# Patient Record
Sex: Male | Born: 1945 | Race: Black or African American | Hispanic: No | Marital: Single | Smoking: Current every day smoker
Health system: Southern US, Community
[De-identification: ages and names within clinical notes are randomized; demographics above are authoritative.]

---

## 2011-08-23 ENCOUNTER — Other Ambulatory Visit: Payer: Self-pay | Admitting: Family Medicine

## 2011-08-23 ENCOUNTER — Ambulatory Visit
Admission: RE | Admit: 2011-08-23 | Discharge: 2011-08-23 | Disposition: A | Discharge status: Home / Self Care | Payer: Managed Care, Other (non HMO) | Source: Ambulatory Visit | Attending: Family Medicine | Admitting: Family Medicine

## 2011-08-23 DIAGNOSIS — R05 Cough: Secondary | ICD-10-CM

## 2015-05-19 ENCOUNTER — Other Ambulatory Visit (HOSPITAL_COMMUNITY): Payer: Self-pay | Admitting: Internal Medicine

## 2015-05-19 ENCOUNTER — Ambulatory Visit (HOSPITAL_COMMUNITY)
Admission: RE | Admit: 2015-05-19 | Discharge: 2015-05-19 | Disposition: A | Discharge status: Home / Self Care | Payer: Medicare HMO | Source: Ambulatory Visit | Attending: Internal Medicine | Admitting: Internal Medicine

## 2015-05-19 DIAGNOSIS — R059 Cough, unspecified: Secondary | ICD-10-CM

## 2015-05-19 DIAGNOSIS — R05 Cough: Secondary | ICD-10-CM

## 2015-05-19 DIAGNOSIS — R0989 Other specified symptoms and signs involving the circulatory and respiratory systems: Secondary | ICD-10-CM | POA: Diagnosis not present

## 2015-08-06 DIAGNOSIS — R1031 Right lower quadrant pain: Secondary | ICD-10-CM | POA: Diagnosis not present

## 2015-08-06 DIAGNOSIS — Z0000 Encounter for general adult medical examination without abnormal findings: Secondary | ICD-10-CM | POA: Diagnosis not present

## 2015-08-06 DIAGNOSIS — R05 Cough: Secondary | ICD-10-CM | POA: Diagnosis not present

## 2015-08-06 DIAGNOSIS — L309 Dermatitis, unspecified: Secondary | ICD-10-CM | POA: Diagnosis not present

## 2015-08-06 DIAGNOSIS — E7800 Pure hypercholesterolemia, unspecified: Secondary | ICD-10-CM | POA: Diagnosis not present

## 2015-08-06 DIAGNOSIS — Z125 Encounter for screening for malignant neoplasm of prostate: Secondary | ICD-10-CM | POA: Diagnosis not present

## 2015-08-06 DIAGNOSIS — Z23 Encounter for immunization: Secondary | ICD-10-CM | POA: Diagnosis not present

## 2015-09-05 DIAGNOSIS — R102 Pelvic and perineal pain: Secondary | ICD-10-CM | POA: Diagnosis not present

## 2015-12-01 DIAGNOSIS — R55 Syncope and collapse: Secondary | ICD-10-CM | POA: Diagnosis not present

## 2015-12-01 DIAGNOSIS — Z8619 Personal history of other infectious and parasitic diseases: Secondary | ICD-10-CM | POA: Diagnosis not present

## 2015-12-04 DIAGNOSIS — H524 Presbyopia: Secondary | ICD-10-CM | POA: Diagnosis not present

## 2015-12-04 DIAGNOSIS — H5210 Myopia, unspecified eye: Secondary | ICD-10-CM | POA: Diagnosis not present

## 2015-12-05 DIAGNOSIS — Z0100 Encounter for examination of eyes and vision without abnormal findings: Secondary | ICD-10-CM | POA: Diagnosis not present

## 2016-01-19 ENCOUNTER — Other Ambulatory Visit (HOSPITAL_COMMUNITY): Payer: Self-pay | Admitting: Nurse Practitioner

## 2016-01-19 DIAGNOSIS — B182 Chronic viral hepatitis C: Secondary | ICD-10-CM

## 2016-02-26 ENCOUNTER — Ambulatory Visit (HOSPITAL_COMMUNITY)
Admission: RE | Admit: 2016-02-26 | Discharge: 2016-02-26 | Disposition: A | Discharge status: Home / Self Care | Payer: Commercial Managed Care - HMO | Source: Ambulatory Visit | Attending: Nurse Practitioner | Admitting: Nurse Practitioner

## 2016-02-26 DIAGNOSIS — B1920 Unspecified viral hepatitis C without hepatic coma: Secondary | ICD-10-CM | POA: Diagnosis not present

## 2016-02-26 DIAGNOSIS — B182 Chronic viral hepatitis C: Secondary | ICD-10-CM | POA: Diagnosis not present

## 2016-02-26 DIAGNOSIS — N289 Disorder of kidney and ureter, unspecified: Secondary | ICD-10-CM | POA: Insufficient documentation

## 2016-02-26 DIAGNOSIS — R6889 Other general symptoms and signs: Secondary | ICD-10-CM | POA: Diagnosis not present

## 2016-03-08 DIAGNOSIS — K740 Hepatic fibrosis: Secondary | ICD-10-CM | POA: Diagnosis not present

## 2016-03-08 DIAGNOSIS — B182 Chronic viral hepatitis C: Secondary | ICD-10-CM | POA: Diagnosis not present

## 2016-04-10 DIAGNOSIS — B182 Chronic viral hepatitis C: Secondary | ICD-10-CM | POA: Diagnosis not present

## 2016-05-04 DIAGNOSIS — B182 Chronic viral hepatitis C: Secondary | ICD-10-CM | POA: Diagnosis not present

## 2016-05-04 DIAGNOSIS — K740 Hepatic fibrosis: Secondary | ICD-10-CM | POA: Diagnosis not present

## 2016-06-05 DIAGNOSIS — B182 Chronic viral hepatitis C: Secondary | ICD-10-CM | POA: Diagnosis not present

## 2016-08-23 DIAGNOSIS — Z0000 Encounter for general adult medical examination without abnormal findings: Secondary | ICD-10-CM | POA: Diagnosis not present

## 2016-08-23 DIAGNOSIS — Z125 Encounter for screening for malignant neoplasm of prostate: Secondary | ICD-10-CM | POA: Diagnosis not present

## 2016-08-23 DIAGNOSIS — E7800 Pure hypercholesterolemia, unspecified: Secondary | ICD-10-CM | POA: Diagnosis not present

## 2016-08-23 DIAGNOSIS — Z1211 Encounter for screening for malignant neoplasm of colon: Secondary | ICD-10-CM | POA: Diagnosis not present

## 2016-08-23 DIAGNOSIS — L309 Dermatitis, unspecified: Secondary | ICD-10-CM | POA: Diagnosis not present

## 2016-09-02 ENCOUNTER — Other Ambulatory Visit: Payer: Self-pay | Admitting: Nurse Practitioner

## 2016-09-02 DIAGNOSIS — K740 Hepatic fibrosis: Secondary | ICD-10-CM | POA: Diagnosis not present

## 2016-09-02 DIAGNOSIS — B182 Chronic viral hepatitis C: Secondary | ICD-10-CM | POA: Diagnosis not present

## 2016-09-03 ENCOUNTER — Other Ambulatory Visit: Payer: Self-pay | Admitting: Nurse Practitioner

## 2016-09-03 DIAGNOSIS — K7469 Other cirrhosis of liver: Secondary | ICD-10-CM

## 2016-09-09 ENCOUNTER — Ambulatory Visit
Admission: RE | Admit: 2016-09-09 | Discharge: 2016-09-09 | Disposition: A | Discharge status: Home / Self Care | Payer: Medicare HMO | Source: Ambulatory Visit | Attending: Nurse Practitioner | Admitting: Nurse Practitioner

## 2016-09-09 DIAGNOSIS — K7469 Other cirrhosis of liver: Secondary | ICD-10-CM

## 2016-09-09 DIAGNOSIS — K7460 Unspecified cirrhosis of liver: Secondary | ICD-10-CM | POA: Diagnosis not present

## 2016-09-15 DIAGNOSIS — Z1211 Encounter for screening for malignant neoplasm of colon: Secondary | ICD-10-CM | POA: Diagnosis not present

## 2016-10-05 DIAGNOSIS — D126 Benign neoplasm of colon, unspecified: Secondary | ICD-10-CM | POA: Diagnosis not present

## 2016-10-05 DIAGNOSIS — K644 Residual hemorrhoidal skin tags: Secondary | ICD-10-CM | POA: Diagnosis not present

## 2016-10-05 DIAGNOSIS — D124 Benign neoplasm of descending colon: Secondary | ICD-10-CM | POA: Diagnosis not present

## 2016-10-05 DIAGNOSIS — Z86010 Personal history of colonic polyps: Secondary | ICD-10-CM | POA: Diagnosis not present

## 2016-10-11 DIAGNOSIS — D126 Benign neoplasm of colon, unspecified: Secondary | ICD-10-CM | POA: Diagnosis not present

## 2017-01-05 ENCOUNTER — Ambulatory Visit
Admission: RE | Admit: 2017-01-05 | Discharge: 2017-01-05 | Disposition: A | Discharge status: Home / Self Care | Payer: Medicare HMO | Source: Ambulatory Visit | Attending: Family Medicine | Admitting: Family Medicine

## 2017-01-05 ENCOUNTER — Other Ambulatory Visit: Payer: Self-pay | Admitting: Family Medicine

## 2017-01-05 DIAGNOSIS — R109 Unspecified abdominal pain: Secondary | ICD-10-CM

## 2017-01-05 DIAGNOSIS — R1032 Left lower quadrant pain: Secondary | ICD-10-CM | POA: Diagnosis not present

## 2017-02-25 DIAGNOSIS — K740 Hepatic fibrosis: Secondary | ICD-10-CM | POA: Diagnosis not present

## 2017-02-25 DIAGNOSIS — K7469 Other cirrhosis of liver: Secondary | ICD-10-CM | POA: Diagnosis not present

## 2017-02-28 ENCOUNTER — Other Ambulatory Visit: Payer: Self-pay | Admitting: Nurse Practitioner

## 2017-02-28 DIAGNOSIS — K7469 Other cirrhosis of liver: Secondary | ICD-10-CM

## 2017-03-09 ENCOUNTER — Other Ambulatory Visit: Payer: Medicare HMO

## 2017-03-15 ENCOUNTER — Ambulatory Visit
Admission: RE | Admit: 2017-03-15 | Discharge: 2017-03-15 | Disposition: A | Discharge status: Home / Self Care | Payer: Medicare HMO | Source: Ambulatory Visit | Attending: Nurse Practitioner | Admitting: Nurse Practitioner

## 2017-03-15 DIAGNOSIS — K7469 Other cirrhosis of liver: Secondary | ICD-10-CM

## 2017-03-15 DIAGNOSIS — K7460 Unspecified cirrhosis of liver: Secondary | ICD-10-CM | POA: Diagnosis not present

## 2017-04-12 DIAGNOSIS — L308 Other specified dermatitis: Secondary | ICD-10-CM | POA: Diagnosis not present

## 2017-04-19 DIAGNOSIS — L308 Other specified dermatitis: Secondary | ICD-10-CM | POA: Diagnosis not present

## 2017-05-10 ENCOUNTER — Other Ambulatory Visit: Payer: Self-pay | Admitting: Gastroenterology

## 2017-05-10 DIAGNOSIS — Z86010 Personal history of colonic polyps: Secondary | ICD-10-CM | POA: Diagnosis not present

## 2017-05-10 DIAGNOSIS — Z8619 Personal history of other infectious and parasitic diseases: Secondary | ICD-10-CM | POA: Diagnosis not present

## 2017-05-10 DIAGNOSIS — R1032 Left lower quadrant pain: Secondary | ICD-10-CM | POA: Diagnosis not present

## 2017-05-10 DIAGNOSIS — K740 Hepatic fibrosis: Secondary | ICD-10-CM | POA: Diagnosis not present

## 2017-05-10 DIAGNOSIS — F1990 Other psychoactive substance use, unspecified, uncomplicated: Secondary | ICD-10-CM | POA: Diagnosis not present

## 2017-05-10 DIAGNOSIS — K589 Irritable bowel syndrome without diarrhea: Secondary | ICD-10-CM | POA: Diagnosis not present

## 2017-05-19 ENCOUNTER — Ambulatory Visit
Admission: RE | Admit: 2017-05-19 | Discharge: 2017-05-19 | Disposition: A | Discharge status: Home / Self Care | Payer: Medicare HMO | Source: Ambulatory Visit | Attending: Gastroenterology | Admitting: Gastroenterology

## 2017-05-19 DIAGNOSIS — R1032 Left lower quadrant pain: Secondary | ICD-10-CM

## 2017-05-19 DIAGNOSIS — K740 Hepatic fibrosis: Secondary | ICD-10-CM | POA: Diagnosis not present

## 2017-05-19 MED ORDER — IOPAMIDOL (ISOVUE-300) INJECTION 61%
100.0000 mL | Freq: Once | INTRAVENOUS | Status: AC | PRN
  Administered 2017-05-19: 100 mL via INTRAVENOUS

## 2017-05-24 DIAGNOSIS — R935 Abnormal findings on diagnostic imaging of other abdominal regions, including retroperitoneum: Secondary | ICD-10-CM | POA: Diagnosis not present

## 2017-07-04 DIAGNOSIS — K2980 Duodenitis without bleeding: Secondary | ICD-10-CM | POA: Diagnosis not present

## 2017-07-04 DIAGNOSIS — K7460 Unspecified cirrhosis of liver: Secondary | ICD-10-CM | POA: Diagnosis not present

## 2017-07-04 DIAGNOSIS — D132 Benign neoplasm of duodenum: Secondary | ICD-10-CM | POA: Diagnosis not present

## 2017-07-04 DIAGNOSIS — K2930 Chronic superficial gastritis without bleeding: Secondary | ICD-10-CM | POA: Diagnosis not present

## 2017-07-07 DIAGNOSIS — D132 Benign neoplasm of duodenum: Secondary | ICD-10-CM | POA: Diagnosis not present

## 2017-07-07 DIAGNOSIS — K2930 Chronic superficial gastritis without bleeding: Secondary | ICD-10-CM | POA: Diagnosis not present

## 2017-07-14 DIAGNOSIS — R102 Pelvic and perineal pain: Secondary | ICD-10-CM | POA: Diagnosis not present

## 2017-07-14 DIAGNOSIS — R3912 Poor urinary stream: Secondary | ICD-10-CM | POA: Diagnosis not present

## 2017-07-14 DIAGNOSIS — N401 Enlarged prostate with lower urinary tract symptoms: Secondary | ICD-10-CM | POA: Diagnosis not present

## 2017-07-18 DIAGNOSIS — K3189 Other diseases of stomach and duodenum: Secondary | ICD-10-CM | POA: Diagnosis not present

## 2017-07-18 DIAGNOSIS — Z86010 Personal history of colonic polyps: Secondary | ICD-10-CM | POA: Diagnosis not present

## 2017-07-18 DIAGNOSIS — D132 Benign neoplasm of duodenum: Secondary | ICD-10-CM | POA: Diagnosis not present

## 2017-07-18 DIAGNOSIS — R634 Abnormal weight loss: Secondary | ICD-10-CM | POA: Diagnosis not present

## 2017-07-18 DIAGNOSIS — K7460 Unspecified cirrhosis of liver: Secondary | ICD-10-CM | POA: Diagnosis not present

## 2017-07-25 DIAGNOSIS — K2970 Gastritis, unspecified, without bleeding: Secondary | ICD-10-CM | POA: Diagnosis not present

## 2017-07-25 DIAGNOSIS — D132 Benign neoplasm of duodenum: Secondary | ICD-10-CM | POA: Diagnosis not present

## 2017-07-25 DIAGNOSIS — K317 Polyp of stomach and duodenum: Secondary | ICD-10-CM | POA: Diagnosis not present

## 2017-07-28 DIAGNOSIS — D132 Benign neoplasm of duodenum: Secondary | ICD-10-CM | POA: Diagnosis not present

## 2017-08-08 DIAGNOSIS — K7469 Other cirrhosis of liver: Secondary | ICD-10-CM | POA: Diagnosis not present

## 2017-09-21 DIAGNOSIS — Z0000 Encounter for general adult medical examination without abnormal findings: Secondary | ICD-10-CM | POA: Diagnosis not present

## 2017-09-21 DIAGNOSIS — R911 Solitary pulmonary nodule: Secondary | ICD-10-CM | POA: Diagnosis not present

## 2017-09-21 DIAGNOSIS — L309 Dermatitis, unspecified: Secondary | ICD-10-CM | POA: Diagnosis not present

## 2017-09-21 DIAGNOSIS — E7800 Pure hypercholesterolemia, unspecified: Secondary | ICD-10-CM | POA: Diagnosis not present

## 2017-10-20 ENCOUNTER — Other Ambulatory Visit: Payer: Self-pay | Admitting: Nurse Practitioner

## 2017-10-20 DIAGNOSIS — K7469 Other cirrhosis of liver: Secondary | ICD-10-CM

## 2017-11-02 ENCOUNTER — Ambulatory Visit
Admission: RE | Admit: 2017-11-02 | Discharge: 2017-11-02 | Disposition: A | Discharge status: Home / Self Care | Payer: Medicare HMO | Source: Ambulatory Visit | Attending: Nurse Practitioner | Admitting: Nurse Practitioner

## 2017-11-02 DIAGNOSIS — K7469 Other cirrhosis of liver: Secondary | ICD-10-CM

## 2017-11-02 DIAGNOSIS — K7460 Unspecified cirrhosis of liver: Secondary | ICD-10-CM | POA: Diagnosis not present

## 2017-11-07 ENCOUNTER — Other Ambulatory Visit: Payer: Self-pay | Admitting: Family Medicine

## 2017-11-07 DIAGNOSIS — R911 Solitary pulmonary nodule: Secondary | ICD-10-CM

## 2017-11-23 ENCOUNTER — Ambulatory Visit
Admission: RE | Admit: 2017-11-23 | Discharge: 2017-11-23 | Disposition: A | Discharge status: Home / Self Care | Payer: Self-pay | Source: Ambulatory Visit | Attending: Family Medicine | Admitting: Family Medicine

## 2017-11-23 DIAGNOSIS — J439 Emphysema, unspecified: Secondary | ICD-10-CM | POA: Diagnosis not present

## 2017-11-23 DIAGNOSIS — R911 Solitary pulmonary nodule: Secondary | ICD-10-CM | POA: Diagnosis not present

## 2017-12-10 IMAGING — US US ABDOMEN LIMITED
1 series · 14 of 25 positions shown · non-contrast
Comparison: 09/09/2016

CLINICAL DATA: [place] cirrhosis.  History of hepatitis-C.

EXAM:
ULTRASOUND ABDOMEN LIMITED RIGHT UPPER QUADRANT

[Series 1: us abdomen limited · 0.15mm/px · 14 of 46 slices shown]
[im 1/46]
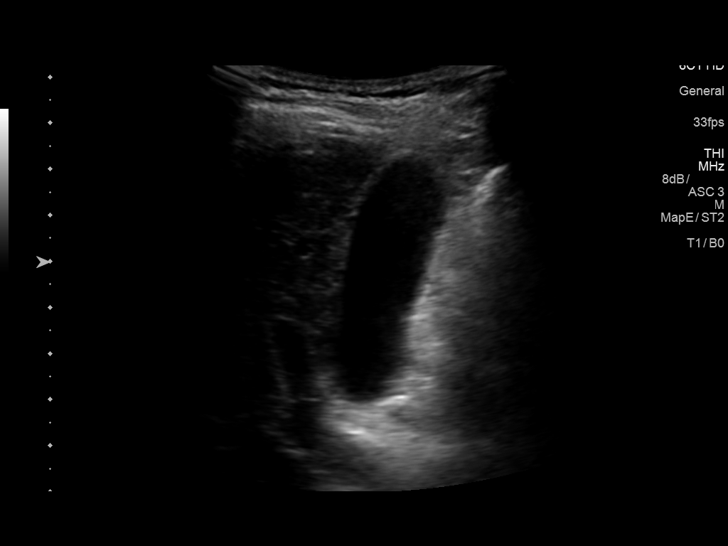
[im 4/46]
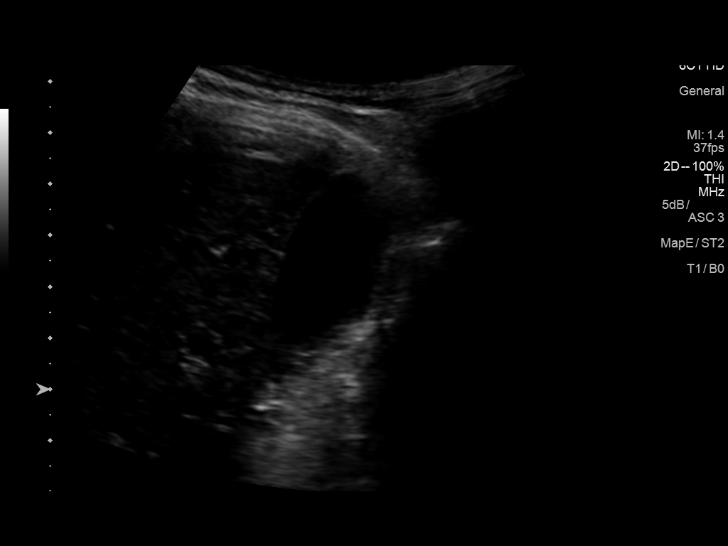
[im 8/46]
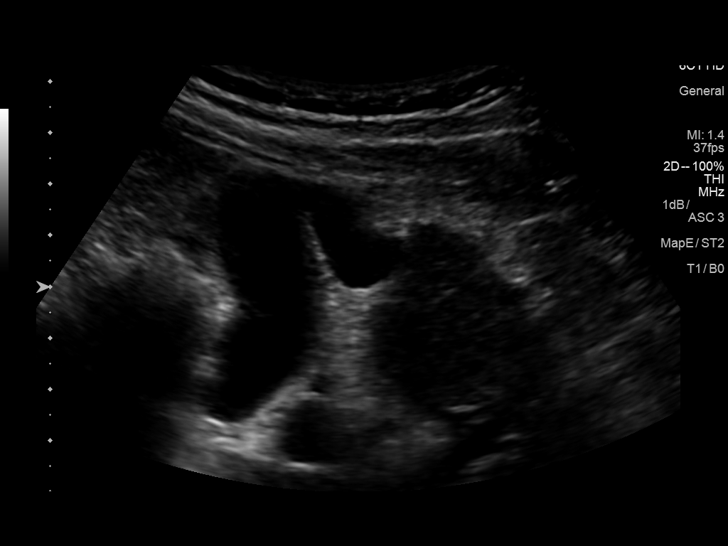
[im 12/46]
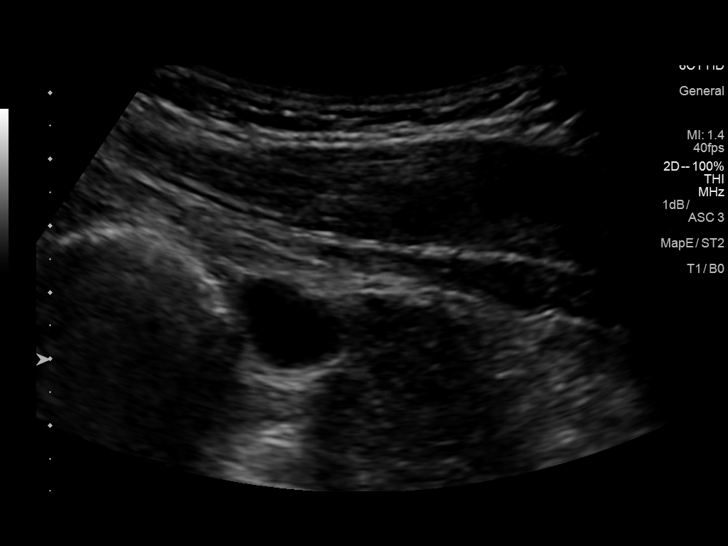
[im 16/46]
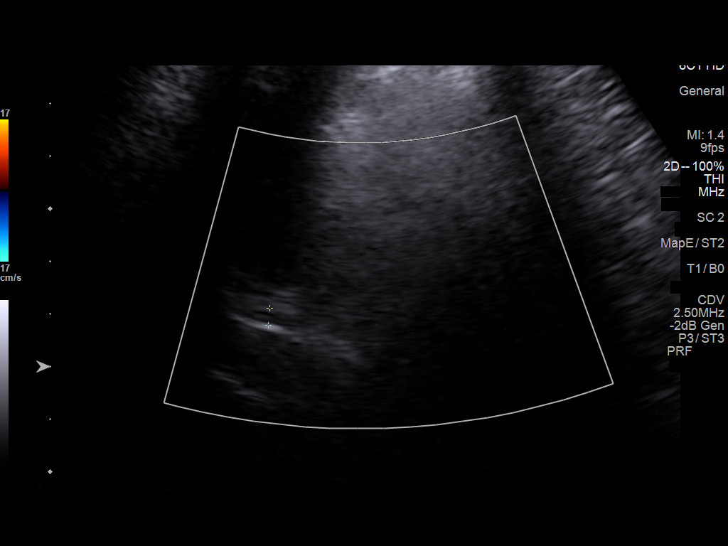
[im 17/46]
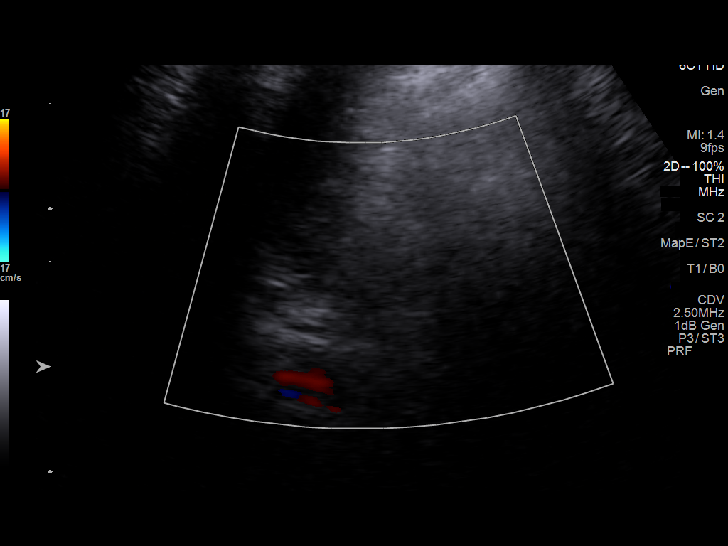
[im 21/46]
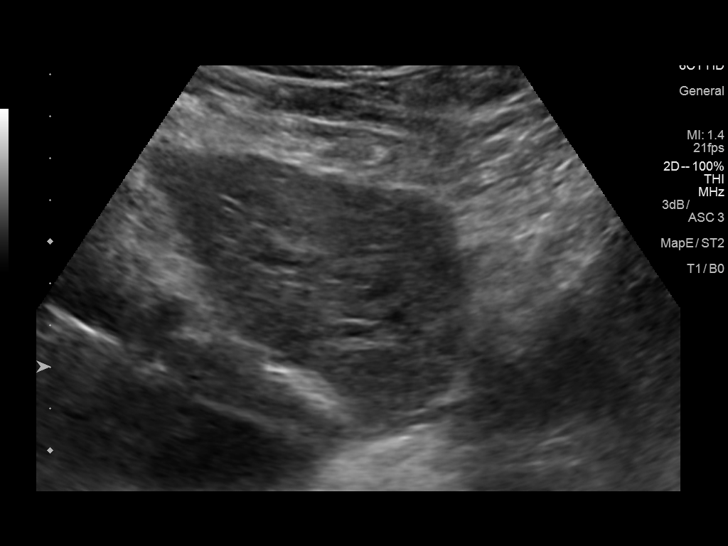
[im 25/46]
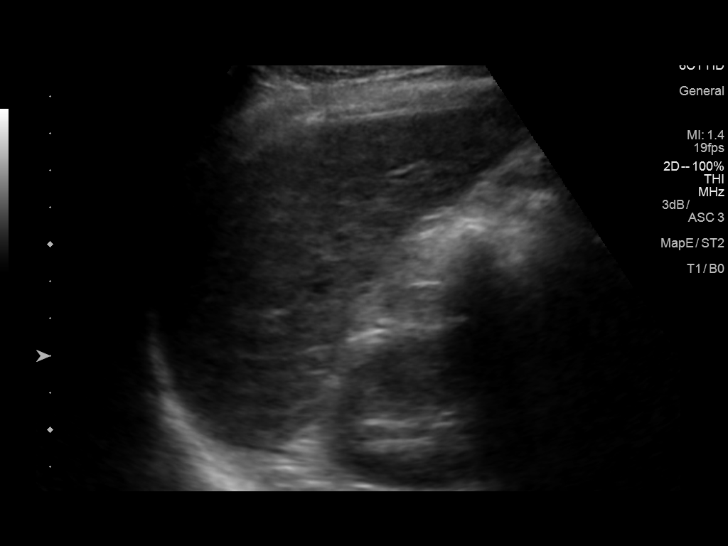
[im 29/46]
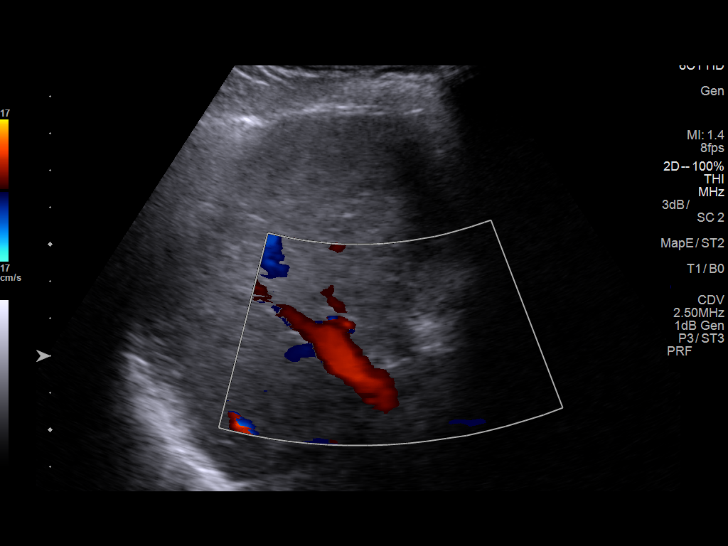
[im 31/46]
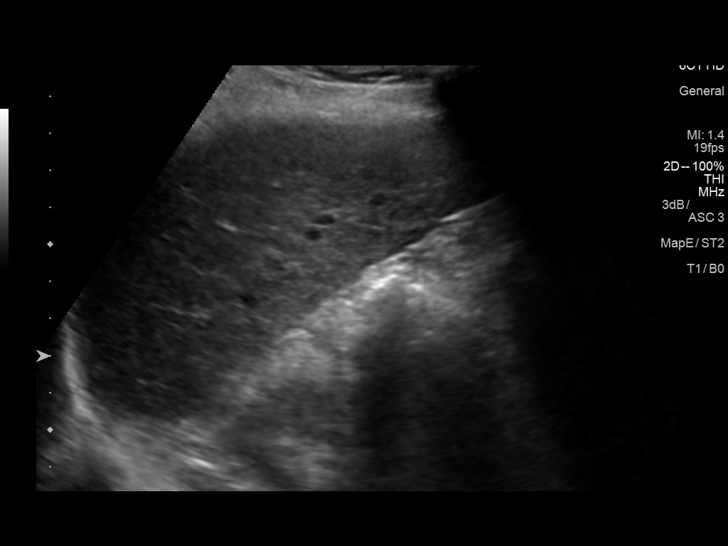
[im 34/46]
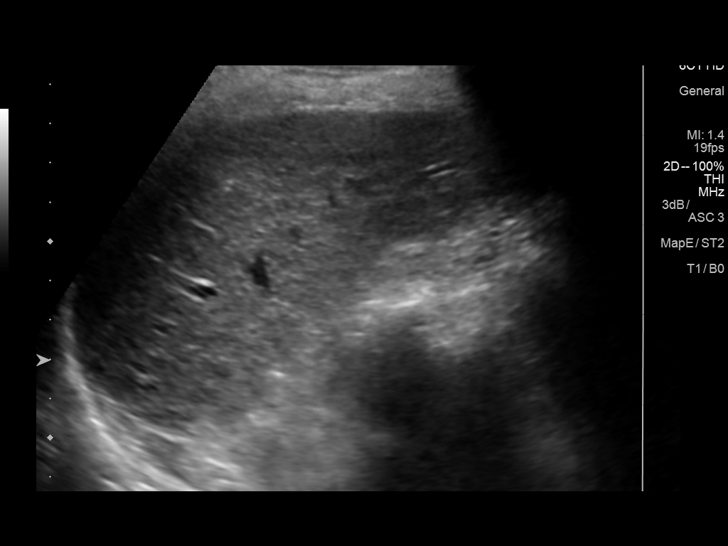
[im 38/46]
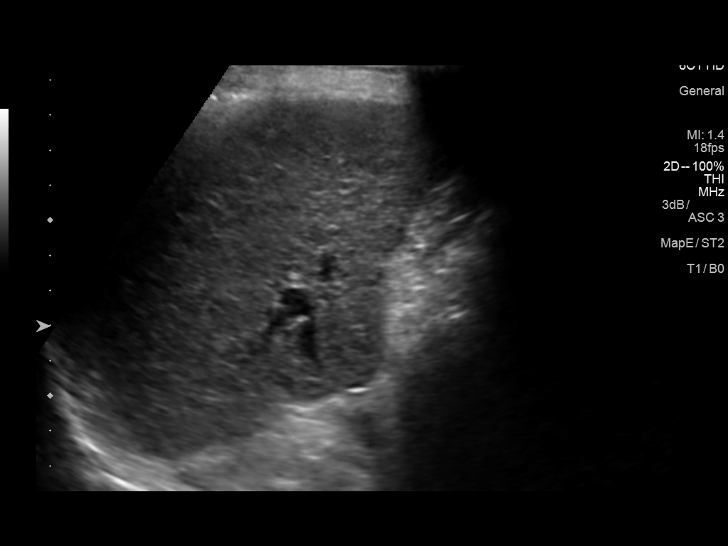
[im 42/46]
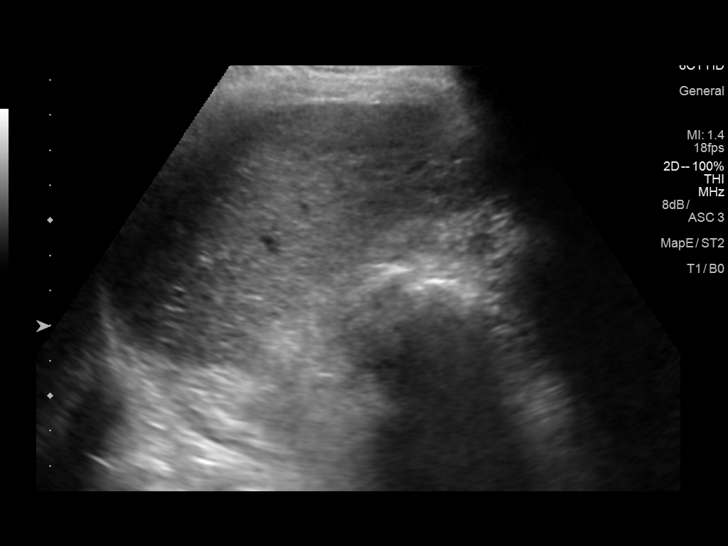
[im 46/46]
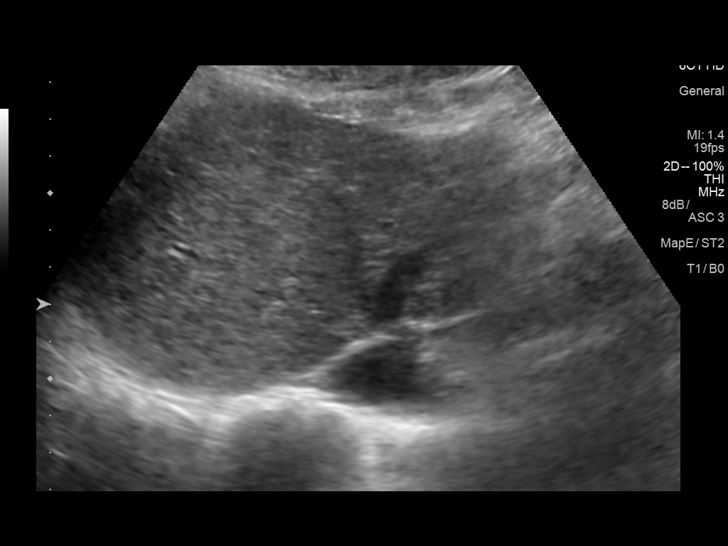

[14 of 25 positions shown; findings below may reference images not displayed]

FINDINGS: Gallbladder:

Gallbladder has a normal appearance. Gallbladder wall is 1.1 mm,
within normal limits. No stones or pericholecystic fluid. No
sonographic Murphy's sign.

Common bile duct:

Diameter: 3.3 mm

Liver:

The liver echotexture is mildly heterogeneous without focal mass.
The surface of the liver is nodular, compatible with cirrhosis.

Portal vein is patent on color Doppler imaging with normal direction
of blood flow towards the liver.
IMPRESSION: 1. Appearance the liver compatible with cirrhosis.
2. No focal abnormality within the liver.
3. Normal direction of flow within the portal vein.

## 2018-01-17 DIAGNOSIS — N401 Enlarged prostate with lower urinary tract symptoms: Secondary | ICD-10-CM | POA: Diagnosis not present

## 2018-01-17 DIAGNOSIS — R3912 Poor urinary stream: Secondary | ICD-10-CM | POA: Diagnosis not present

## 2018-04-20 ENCOUNTER — Emergency Department (HOSPITAL_COMMUNITY)
Admission: EM | Admit: 2018-04-20 | Discharge: 2018-04-20 | Disposition: A | Discharge status: Home / Self Care | Payer: PPO | Attending: Emergency Medicine | Admitting: Emergency Medicine

## 2018-04-20 ENCOUNTER — Encounter (HOSPITAL_COMMUNITY): Payer: Self-pay

## 2018-04-20 ENCOUNTER — Other Ambulatory Visit: Payer: Self-pay

## 2018-04-20 DIAGNOSIS — R109 Unspecified abdominal pain: Secondary | ICD-10-CM | POA: Diagnosis not present

## 2018-04-20 DIAGNOSIS — R1011 Right upper quadrant pain: Secondary | ICD-10-CM | POA: Diagnosis not present

## 2018-04-20 DIAGNOSIS — R110 Nausea: Secondary | ICD-10-CM | POA: Diagnosis not present

## 2018-04-20 LAB — LIPASE, BLOOD: Lipase: 32 U/L (ref 11–51)

## 2018-04-20 LAB — COMPREHENSIVE METABOLIC PANEL
ALT: 21 U/L (ref 0–44)
AST: 35 U/L (ref 15–41)
Albumin: 3.7 g/dL (ref 3.5–5.0)
Alkaline Phosphatase: 76 U/L (ref 38–126)
Anion gap: 8 (ref 5–15)
BUN: 8 mg/dL (ref 8–23)
CO2: 23 mmol/L (ref 22–32)
Calcium: 9.3 mg/dL (ref 8.9–10.3)
Chloride: 106 mmol/L (ref 98–111)
Creatinine, Ser: 1.23 mg/dL (ref 0.61–1.24)
GFR calc Af Amer: >60 mL/min (ref >60)
GFR calc non Af Amer: 58 mL/min — ABNORMAL LOW (ref >60)
Glucose, Bld: 97 mg/dL (ref 70–99)
Potassium: 3.5 mmol/L (ref 3.5–5.1)
Sodium: 137 mmol/L (ref 135–145)
Total Bilirubin: 1 mg/dL (ref 0.3–1.2)
Total Protein: 8.2 g/dL — ABNORMAL HIGH (ref 6.5–8.1)

## 2018-04-20 LAB — CBC
HCT: 45.6 % (ref 39.0–52.0)
Hemoglobin: 14.5 g/dL (ref 13.0–17.0)
MCH: 28.6 pg (ref 26.0–34.0)
MCHC: 31.8 g/dL (ref 30.0–36.0)
MCV: 89.9 fL (ref 80.0–100.0)
Platelets: 184 10*3/uL (ref 150–400)
RBC: 5.07 MIL/uL (ref 4.22–5.81)
RDW: 14.8 % (ref 11.5–15.5)
WBC: 5.4 10*3/uL (ref 4.0–10.5)
nRBC: 0 % (ref 0.0–0.2)

## 2018-04-20 NOTE — ED Triage Notes (Signed)
Pt presents for evaluation of abd pain. States started on Sunday with lower abd cramping. States took gas medication and tylenol with some improvement. States still has pressure to abd. Denies n/v/d.

## 2018-04-20 NOTE — Discharge Instructions (Addendum)
I suspect the pain you are having may potentially be from your gallbladder.  The location and the way you are describing it is consistent with this.  Since you are now pain-free, I think it is fine for you to follow-up with your family doctor to discuss obtaining an ultrasound.  Return the emergency room for unrelenting pain, persistent nausea/vomiting or if you develop a fever.

## 2018-04-20 NOTE — ED Notes (Signed)
Patient verbalizes understanding of discharge instructions. Opportunity for questioning and answers were provided. Armband removed by staff, pt discharged from ED.  

## 2018-04-21 NOTE — ED Provider Notes (Signed)
[facility] EMERGENCY DEPARTMENT Provider Note   CSN: [id number] Arrival date & time: 04/20/18  0938     History   Chief Complaint Chief Complaint  Patient presents with  . Abdominal Pain    HPI Donald Graham is a 72 y.o. male.  HPI   72 year old male with abdominal pain.  Started this past weekend.  The pain has been intermittent.  Right-sided.  Comes and goes.  He first noticed it after a very large meal. Has been nauseated at times. No vomiting. No urinary complaints. No fever or chills.   History reviewed. No pertinent past medical history.  There are no active problems to display for this patient.   History reviewed. No pertinent surgical history.      Home Medications    Prior to Admission medications   Medication Sig Start Date End Date Taking? Authorizing Provider  acetaminophen (TYLENOL) 500 MG tablet Take 1,000 mg by mouth every 6 (six) hours as needed for mild pain.   Yes [provider]  alum & mag hydroxide-simeth (MAALOX/MYLANTA) 200-200-20 MG/5ML suspension Take 30 mLs by mouth every 6 (six) hours as needed for indigestion or heartburn.   Yes [provider]  bismuth subsalicylate (PEPTO BISMOL) 262 MG/15ML suspension Take 30 mLs by mouth every 6 (six) hours as needed for indigestion.   Yes [provider]  ibuprofen (ADVIL,MOTRIN) 200 MG tablet Take 800 mg by mouth every 6 (six) hours as needed for mild pain or cramping.   Yes [provider]  simethicone (MYLICON) 80 MG chewable tablet Chew 80 mg by mouth every 6 (six) hours as needed for flatulence.   Yes [provider]    Family History No family history on file.  Social History Social History   Tobacco Use  . Smoking status: Not on file  Substance Use Topics  . Alcohol use: Not on file  . Drug use: Not on file     Allergies   Patient has no known allergies.   Review of Systems Review of Systems  All systems reviewed  and negative, other than as noted in HPI.  Physical Exam Updated Vital Signs BP (!) 159/91 (BP Location: Right Arm)   Pulse 71   Temp 98.2 F (36.8 C) (Oral)   Resp 16   SpO2 100%   Physical Exam  Constitutional: He appears well-developed and well-nourished. No distress.  HENT:  Head: Normocephalic and atraumatic.  Eyes: Conjunctivae are normal. Right eye exhibits no discharge. Left eye exhibits no discharge.  Neck: Neck supple.  Cardiovascular: Normal rate, regular rhythm and normal heart sounds. Exam reveals no gallop and no friction rub.  No murmur heard. Pulmonary/Chest: Effort normal and breath sounds normal. No respiratory distress.  Abdominal: Soft. He exhibits no distension. There is tenderness.  Mild ttp in RUQ  Musculoskeletal: He exhibits no edema or tenderness.  Neurological: He is alert.  Skin: Skin is warm and dry.  Psychiatric: He has a normal mood and affect. His behavior is normal. Thought content normal.  Nursing note and vitals reviewed.    ED Treatments / Results  Labs (all labs ordered are listed, but only abnormal results are displayed) Labs Reviewed  COMPREHENSIVE METABOLIC PANEL - Abnormal; Notable for the following components:      Result Value   Total Protein 8.2 (*)    GFR calc non Af Amer 58 (*)    All other components within normal limits  LIPASE, BLOOD  CBC  EKG None  Radiology No results found.  Procedures Procedures (including critical care time)  Medications Ordered in ED Medications - No data to display   Initial Impression / Assessment and Plan / ED Course  I have reviewed the triage vital signs and the nursing notes.  Pertinent labs & imaging results that were available during my care of the patient were reviewed by me and considered in my medical decision making (see chart for details).     72 year old male with intermittent abdominal pain for the past week.  He is minimally tender in his right upper quadrant but  says he is feeling markedly better at this point.  I suspect he may potentially have symptomatic cholelithiasis.  Recommend obtaining an ultrasound.  He would prefer to follow-up with his PCP.  I think this is reasonable.  He is afebrile.  Generally appears well.  LFTs and lipase are normal.  He does not have a leukocytosis.  Symptomatic treatment in the meantime.  Emergent return precautions discussed.  Final Clinical Impressions(s) / ED Diagnoses   Final diagnoses:  Abdominal pain, unspecified abdominal location    ED Discharge Orders    None       Virgel Manifold, MD 04/21/18 1544

## 2018-04-25 DIAGNOSIS — R109 Unspecified abdominal pain: Secondary | ICD-10-CM | POA: Diagnosis not present

## 2018-04-25 DIAGNOSIS — L309 Dermatitis, unspecified: Secondary | ICD-10-CM | POA: Diagnosis not present

## 2018-05-08 DIAGNOSIS — K7469 Other cirrhosis of liver: Secondary | ICD-10-CM | POA: Diagnosis not present

## 2018-05-09 ENCOUNTER — Other Ambulatory Visit: Payer: Self-pay | Admitting: Nurse Practitioner

## 2018-05-09 DIAGNOSIS — K7460 Unspecified cirrhosis of liver: Secondary | ICD-10-CM

## 2018-05-10 ENCOUNTER — Ambulatory Visit
Admission: RE | Admit: 2018-05-10 | Discharge: 2018-05-10 | Disposition: A | Discharge status: Home / Self Care | Payer: Self-pay | Source: Ambulatory Visit | Attending: Nurse Practitioner | Admitting: Nurse Practitioner

## 2018-05-10 DIAGNOSIS — K7460 Unspecified cirrhosis of liver: Secondary | ICD-10-CM

## 2018-05-10 DIAGNOSIS — K7689 Other specified diseases of liver: Secondary | ICD-10-CM | POA: Diagnosis not present

## 2018-06-12 DIAGNOSIS — H5371 Glare sensitivity: Secondary | ICD-10-CM | POA: Diagnosis not present

## 2018-06-12 DIAGNOSIS — H524 Presbyopia: Secondary | ICD-10-CM | POA: Diagnosis not present

## 2018-06-12 DIAGNOSIS — H16223 Keratoconjunctivitis sicca, not specified as Sjogren's, bilateral: Secondary | ICD-10-CM | POA: Insufficient documentation

## 2018-06-12 DIAGNOSIS — H5203 Hypermetropia, bilateral: Secondary | ICD-10-CM | POA: Diagnosis not present

## 2018-06-12 DIAGNOSIS — H02834 Dermatochalasis of left upper eyelid: Secondary | ICD-10-CM | POA: Diagnosis not present

## 2018-06-12 DIAGNOSIS — H43813 Vitreous degeneration, bilateral: Secondary | ICD-10-CM | POA: Insufficient documentation

## 2018-06-12 DIAGNOSIS — H52203 Unspecified astigmatism, bilateral: Secondary | ICD-10-CM | POA: Diagnosis not present

## 2018-06-12 DIAGNOSIS — H04123 Dry eye syndrome of bilateral lacrimal glands: Secondary | ICD-10-CM | POA: Diagnosis not present

## 2018-06-12 DIAGNOSIS — H2513 Age-related nuclear cataract, bilateral: Secondary | ICD-10-CM | POA: Diagnosis not present

## 2018-06-12 DIAGNOSIS — H02831 Dermatochalasis of right upper eyelid: Secondary | ICD-10-CM | POA: Diagnosis not present

## 2018-06-12 DIAGNOSIS — H538 Other visual disturbances: Secondary | ICD-10-CM | POA: Diagnosis not present

## 2018-08-22 ENCOUNTER — Other Ambulatory Visit: Payer: Self-pay | Admitting: Gastroenterology

## 2018-08-22 DIAGNOSIS — Z86010 Personal history of colonic polyps: Secondary | ICD-10-CM | POA: Diagnosis not present

## 2018-08-22 DIAGNOSIS — K3189 Other diseases of stomach and duodenum: Secondary | ICD-10-CM | POA: Diagnosis not present

## 2018-08-22 DIAGNOSIS — D132 Benign neoplasm of duodenum: Secondary | ICD-10-CM | POA: Diagnosis not present

## 2018-08-22 DIAGNOSIS — K7460 Unspecified cirrhosis of liver: Secondary | ICD-10-CM | POA: Diagnosis not present

## 2018-09-07 ENCOUNTER — Ambulatory Visit (HOSPITAL_COMMUNITY): Admit: 2018-09-07 | Payer: PPO | Admitting: Gastroenterology

## 2018-09-07 ENCOUNTER — Encounter (HOSPITAL_COMMUNITY): Payer: Self-pay

## 2018-09-07 SURGERY — ESOPHAGOGASTRODUODENOSCOPY (EGD) WITH PROPOFOL
Anesthesia: Monitor Anesthesia Care

## 2018-10-18 DIAGNOSIS — L309 Dermatitis, unspecified: Secondary | ICD-10-CM | POA: Diagnosis not present

## 2018-10-18 DIAGNOSIS — Z0000 Encounter for general adult medical examination without abnormal findings: Secondary | ICD-10-CM | POA: Diagnosis not present

## 2018-10-18 DIAGNOSIS — E7800 Pure hypercholesterolemia, unspecified: Secondary | ICD-10-CM | POA: Diagnosis not present

## 2018-10-18 DIAGNOSIS — Z125 Encounter for screening for malignant neoplasm of prostate: Secondary | ICD-10-CM | POA: Diagnosis not present

## 2018-10-19 DIAGNOSIS — Z125 Encounter for screening for malignant neoplasm of prostate: Secondary | ICD-10-CM | POA: Diagnosis not present

## 2018-10-19 DIAGNOSIS — E7800 Pure hypercholesterolemia, unspecified: Secondary | ICD-10-CM | POA: Diagnosis not present

## 2018-10-31 DIAGNOSIS — Z20828 Contact with and (suspected) exposure to other viral communicable diseases: Secondary | ICD-10-CM | POA: Diagnosis not present

## 2018-11-02 DIAGNOSIS — K7469 Other cirrhosis of liver: Secondary | ICD-10-CM | POA: Diagnosis not present

## 2018-11-18 IMAGING — CT CT CHEST W/O CM
2 of 4 series · 13 of 36 positions shown, 16 images · non-contrast
Comparison: 05/19/2017

CLINICAL DATA: Smoking history. Chronic cough. Pulmonary nodule
seen on previous abdominal CT.

EXAM:
CT CHEST WITHOUT CONTRAST
TECHNIQUE: Multidetector CT imaging of the chest was performed following the
standard protocol without IV contrast.

[Series 2: chest 2.00 br40 s3 ax · axial · 0.57mm/px · z∈[+1379,+1657]mm · 10 of 165 slices shown, 13 images]
[im 13/165  mediastinal]
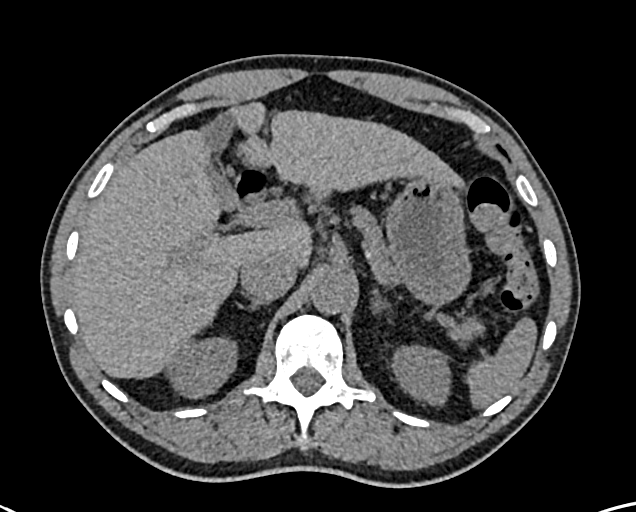
[im 13/165  lung]
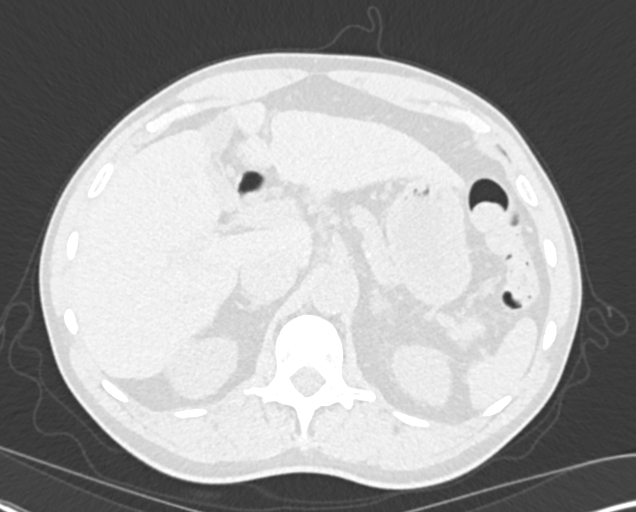
[im 26/165  lung]
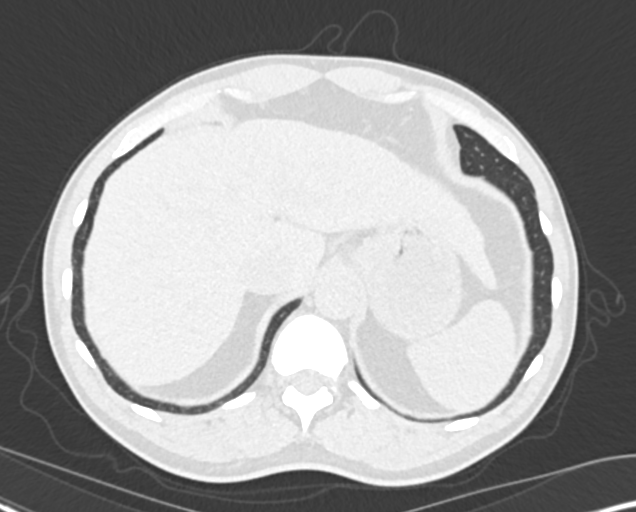
[im 51/165  lung]
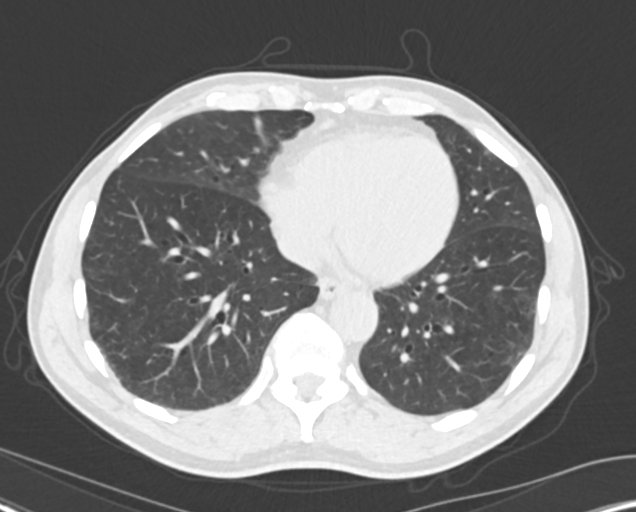
[im 64/165  lung]
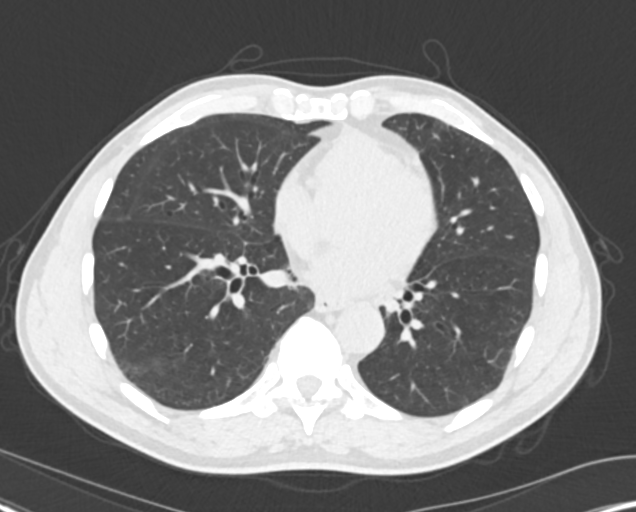
[im 76/165  mediastinal]
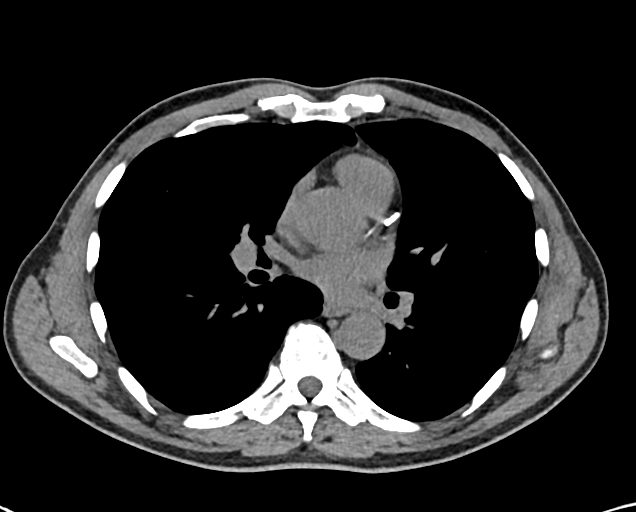
[im 76/165  lung]
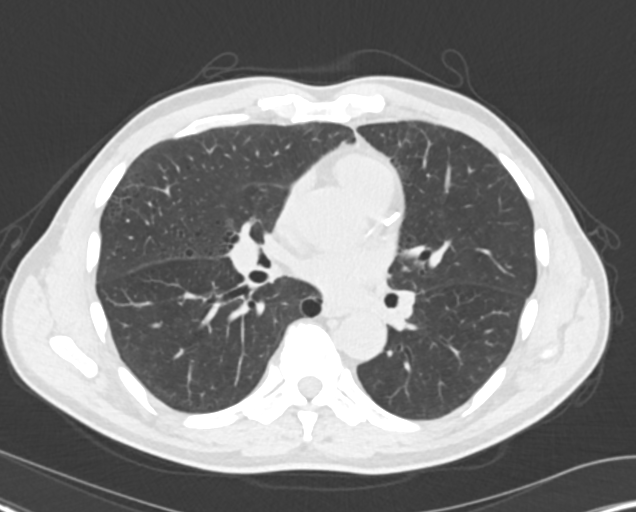
[im 89/165  lung]
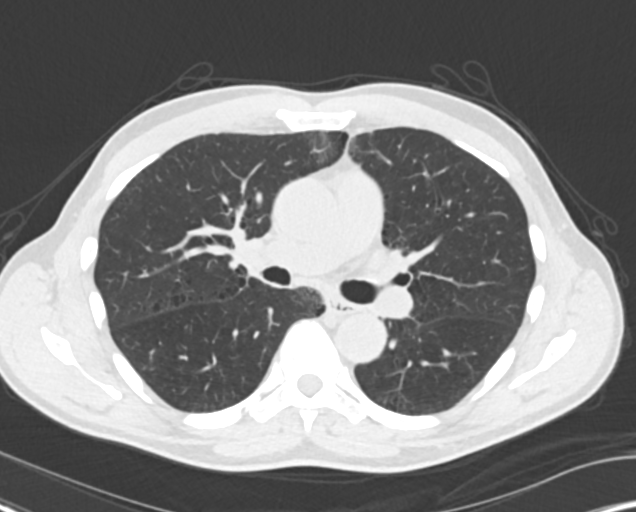
[im 101/165  lung]
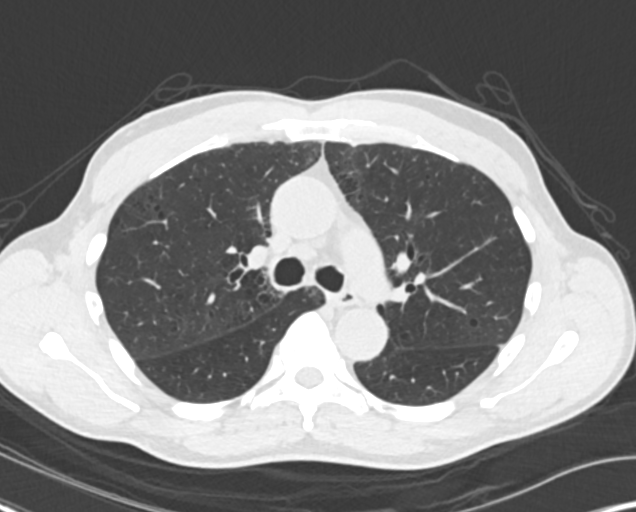
[im 127/165  lung]
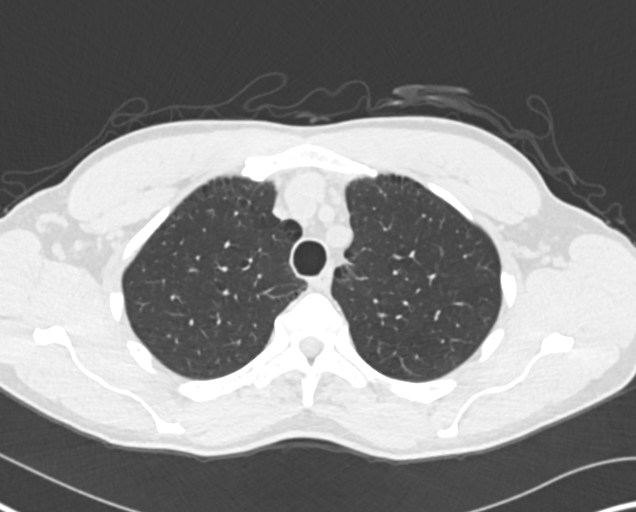
[im 139/165  mediastinal]
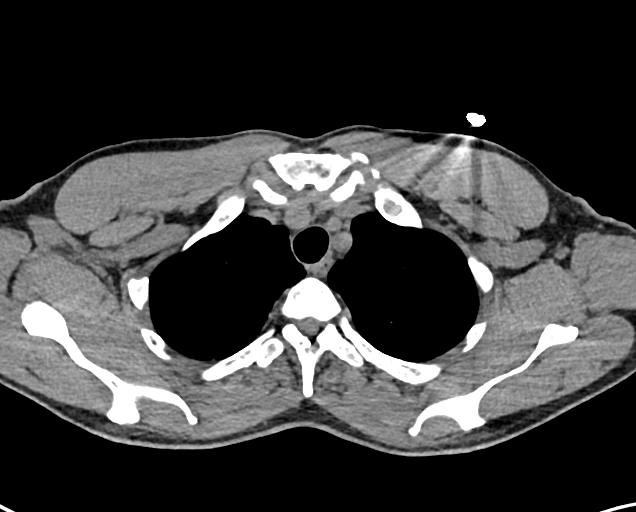
[im 139/165  lung]
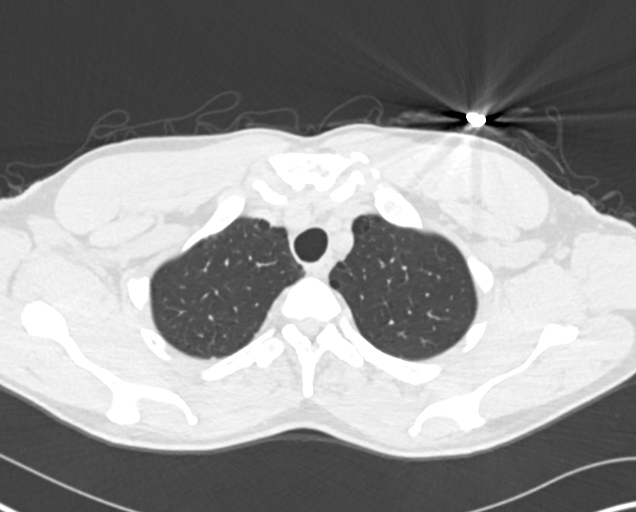
[im 152/165  lung]
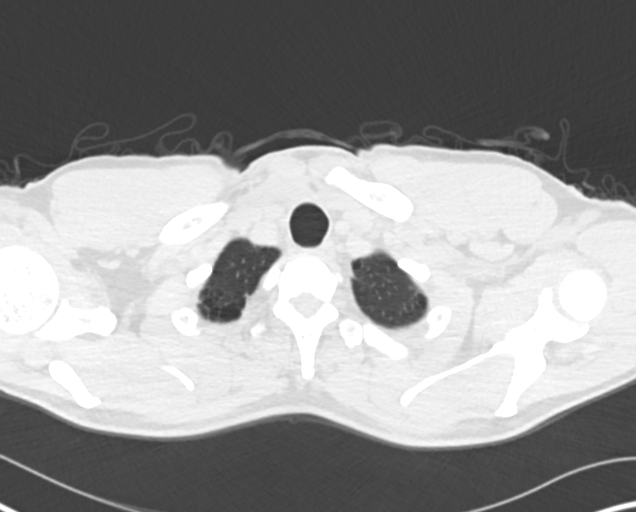

[Series 4: chest 2.00 br40 s3 cor · coronal · 0.65mm/px · 3 of 145 slices shown]
[im 29/145  lung]
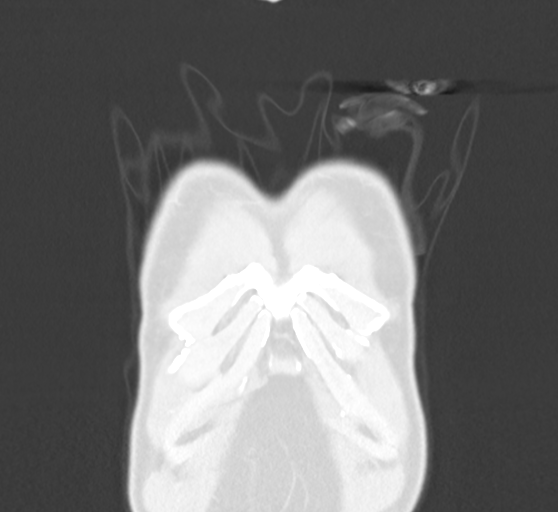
[im 58/145  lung]
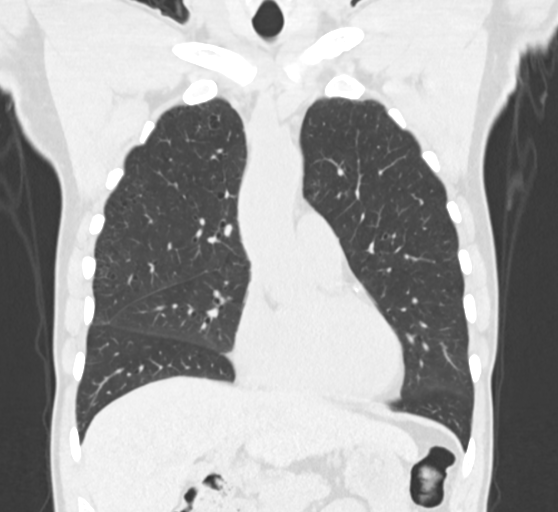
[im 87/145  lung]
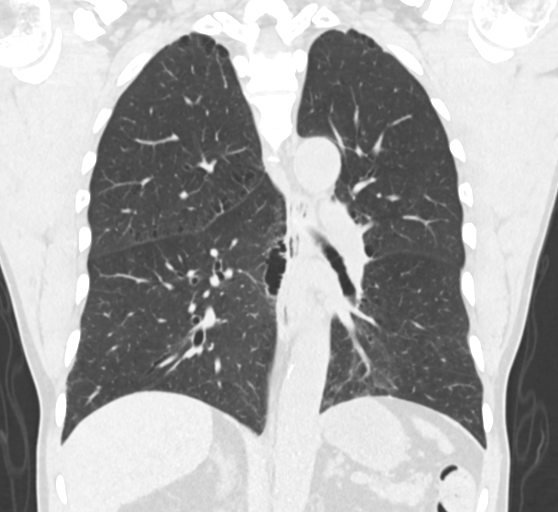

[13 of 36 positions shown; findings below may reference images not displayed]

FINDINGS: Cardiovascular: Heart size is normal. There is extensive coronary
artery calcification, particularly in the left system. There is
aortic atherosclerotic calcification at the arch. No sign of
aneurysm.

Mediastinum/Nodes: No mass or lymphadenopathy.

Lungs/Pleura: 7 mm nodule in the peripheral lateral left lower lobe
is redemonstrated, image 120 on today's study. No sign of
enlargement, though the previous study was somewhat motion degraded.
Elsewhere, the lungs show emphysema with scarring and areas of hazy
opacity likely to represent chronic respiratory bronchiolitis of
smoking. No pleural effusion.

Upper Abdomen: Normal

Musculoskeletal: Normal
IMPRESSION: Stable 7 mm peripheral lateral left lower lobe pulmonary nodule. CT
at 18-24 months (from today's scan) is considered optional for
low-risk patients, but is recommended for high-risk patients. This
recommendation follows the consensus statement: Guidelines for
Management of Incidental Pulmonary Nodules Detected on CT Images:
From the [HOSPITAL] 3492; Radiology 3492; [DATE]. given
the other pulmonary findings, I would assume this patient is at high
risk. Additionally, I would question if the patient would qualify
for routine CT lung screening.

Aortic atherosclerosis.  Extensive coronary artery calcification.

Emphysema

## 2018-12-19 ENCOUNTER — Other Ambulatory Visit: Payer: Self-pay | Admitting: Nurse Practitioner

## 2018-12-19 DIAGNOSIS — K7469 Other cirrhosis of liver: Secondary | ICD-10-CM

## 2018-12-29 ENCOUNTER — Other Ambulatory Visit: Payer: Self-pay

## 2019-01-18 ENCOUNTER — Ambulatory Visit
Admission: RE | Admit: 2019-01-18 | Discharge: 2019-01-18 | Disposition: A | Discharge status: Home / Self Care | Payer: PPO | Source: Ambulatory Visit | Attending: Nurse Practitioner | Admitting: Nurse Practitioner

## 2019-01-18 DIAGNOSIS — K7460 Unspecified cirrhosis of liver: Secondary | ICD-10-CM | POA: Diagnosis not present

## 2019-01-18 DIAGNOSIS — K7469 Other cirrhosis of liver: Secondary | ICD-10-CM

## 2019-04-16 DIAGNOSIS — K7469 Other cirrhosis of liver: Secondary | ICD-10-CM | POA: Diagnosis not present

## 2019-05-22 ENCOUNTER — Other Ambulatory Visit: Payer: Self-pay

## 2019-05-22 DIAGNOSIS — Z20822 Contact with and (suspected) exposure to covid-19: Secondary | ICD-10-CM

## 2019-05-24 LAB — NOVEL CORONAVIRUS, NAA: SARS-CoV-2, NAA: NOT DETECTED

## 2019-05-31 DIAGNOSIS — K7469 Other cirrhosis of liver: Secondary | ICD-10-CM | POA: Diagnosis not present

## 2019-06-05 ENCOUNTER — Other Ambulatory Visit: Payer: Self-pay | Admitting: Family Medicine

## 2019-06-05 DIAGNOSIS — R911 Solitary pulmonary nodule: Secondary | ICD-10-CM

## 2019-06-13 ENCOUNTER — Other Ambulatory Visit: Payer: PPO

## 2019-06-13 ENCOUNTER — Ambulatory Visit
Admission: RE | Admit: 2019-06-13 | Discharge: 2019-06-13 | Disposition: A | Discharge status: Home / Self Care | Payer: PPO | Source: Ambulatory Visit | Attending: Family Medicine | Admitting: Family Medicine

## 2019-06-13 DIAGNOSIS — R911 Solitary pulmonary nodule: Secondary | ICD-10-CM | POA: Diagnosis not present

## 2019-08-06 DIAGNOSIS — K7469 Other cirrhosis of liver: Secondary | ICD-10-CM | POA: Diagnosis not present

## 2019-08-08 ENCOUNTER — Other Ambulatory Visit: Payer: Self-pay | Admitting: Nurse Practitioner

## 2019-08-08 DIAGNOSIS — K7469 Other cirrhosis of liver: Secondary | ICD-10-CM

## 2019-08-15 ENCOUNTER — Ambulatory Visit
Admission: RE | Admit: 2019-08-15 | Discharge: 2019-08-15 | Disposition: A | Discharge status: Home / Self Care | Payer: PPO | Source: Ambulatory Visit | Attending: Nurse Practitioner | Admitting: Nurse Practitioner

## 2019-08-15 DIAGNOSIS — K7460 Unspecified cirrhosis of liver: Secondary | ICD-10-CM | POA: Diagnosis not present

## 2019-08-15 DIAGNOSIS — K7469 Other cirrhosis of liver: Secondary | ICD-10-CM

## 2019-09-11 DIAGNOSIS — K7460 Unspecified cirrhosis of liver: Secondary | ICD-10-CM | POA: Diagnosis not present

## 2019-09-11 DIAGNOSIS — D132 Benign neoplasm of duodenum: Secondary | ICD-10-CM | POA: Diagnosis not present

## 2019-09-11 DIAGNOSIS — Z86010 Personal history of colonic polyps: Secondary | ICD-10-CM | POA: Diagnosis not present

## 2019-09-11 DIAGNOSIS — K3189 Other diseases of stomach and duodenum: Secondary | ICD-10-CM | POA: Diagnosis not present

## 2019-10-15 DIAGNOSIS — Z1159 Encounter for screening for other viral diseases: Secondary | ICD-10-CM | POA: Diagnosis not present

## 2019-10-18 DIAGNOSIS — K229 Disease of esophagus, unspecified: Secondary | ICD-10-CM | POA: Diagnosis not present

## 2019-10-18 DIAGNOSIS — K648 Other hemorrhoids: Secondary | ICD-10-CM | POA: Diagnosis not present

## 2019-10-18 DIAGNOSIS — B3781 Candidal esophagitis: Secondary | ICD-10-CM | POA: Diagnosis not present

## 2019-10-18 DIAGNOSIS — K2970 Gastritis, unspecified, without bleeding: Secondary | ICD-10-CM | POA: Diagnosis not present

## 2019-10-18 DIAGNOSIS — Z86010 Personal history of colonic polyps: Secondary | ICD-10-CM | POA: Diagnosis not present

## 2019-10-18 DIAGNOSIS — K635 Polyp of colon: Secondary | ICD-10-CM | POA: Diagnosis not present

## 2019-10-18 DIAGNOSIS — K317 Polyp of stomach and duodenum: Secondary | ICD-10-CM | POA: Diagnosis not present

## 2019-10-23 DIAGNOSIS — Z0000 Encounter for general adult medical examination without abnormal findings: Secondary | ICD-10-CM | POA: Diagnosis not present

## 2019-10-23 DIAGNOSIS — B3781 Candidal esophagitis: Secondary | ICD-10-CM | POA: Diagnosis not present

## 2019-10-23 DIAGNOSIS — K635 Polyp of colon: Secondary | ICD-10-CM | POA: Diagnosis not present

## 2019-10-23 DIAGNOSIS — M545 Low back pain: Secondary | ICD-10-CM | POA: Diagnosis not present

## 2019-10-23 DIAGNOSIS — E7800 Pure hypercholesterolemia, unspecified: Secondary | ICD-10-CM | POA: Diagnosis not present

## 2019-11-20 DIAGNOSIS — H938X1 Other specified disorders of right ear: Secondary | ICD-10-CM | POA: Diagnosis not present

## 2020-02-04 ENCOUNTER — Other Ambulatory Visit: Payer: Self-pay | Admitting: Nurse Practitioner

## 2020-02-04 DIAGNOSIS — K7469 Other cirrhosis of liver: Secondary | ICD-10-CM

## 2020-02-13 ENCOUNTER — Ambulatory Visit
Admission: RE | Admit: 2020-02-13 | Discharge: 2020-02-13 | Disposition: A | Discharge status: Home / Self Care | Payer: PPO | Source: Ambulatory Visit | Attending: Nurse Practitioner | Admitting: Nurse Practitioner

## 2020-02-13 DIAGNOSIS — K7469 Other cirrhosis of liver: Secondary | ICD-10-CM

## 2020-02-13 DIAGNOSIS — K7689 Other specified diseases of liver: Secondary | ICD-10-CM | POA: Diagnosis not present

## 2020-03-05 DIAGNOSIS — M6281 Muscle weakness (generalized): Secondary | ICD-10-CM | POA: Diagnosis not present

## 2020-03-05 DIAGNOSIS — H539 Unspecified visual disturbance: Secondary | ICD-10-CM | POA: Diagnosis not present

## 2020-03-05 DIAGNOSIS — F17200 Nicotine dependence, unspecified, uncomplicated: Secondary | ICD-10-CM | POA: Diagnosis not present

## 2020-03-05 DIAGNOSIS — R2689 Other abnormalities of gait and mobility: Secondary | ICD-10-CM | POA: Diagnosis not present

## 2020-03-05 DIAGNOSIS — Z1389 Encounter for screening for other disorder: Secondary | ICD-10-CM | POA: Diagnosis not present

## 2020-03-05 DIAGNOSIS — H532 Diplopia: Secondary | ICD-10-CM | POA: Diagnosis not present

## 2020-03-05 DIAGNOSIS — R29898 Other symptoms and signs involving the musculoskeletal system: Secondary | ICD-10-CM | POA: Diagnosis not present

## 2020-03-05 DIAGNOSIS — R519 Headache, unspecified: Secondary | ICD-10-CM | POA: Diagnosis not present

## 2020-03-12 DIAGNOSIS — R279 Unspecified lack of coordination: Secondary | ICD-10-CM | POA: Diagnosis not present

## 2020-03-12 DIAGNOSIS — R2689 Other abnormalities of gait and mobility: Secondary | ICD-10-CM | POA: Diagnosis not present

## 2020-04-16 DIAGNOSIS — R351 Nocturia: Secondary | ICD-10-CM | POA: Diagnosis not present

## 2020-04-16 DIAGNOSIS — N401 Enlarged prostate with lower urinary tract symptoms: Secondary | ICD-10-CM | POA: Diagnosis not present

## 2020-04-16 DIAGNOSIS — Z125 Encounter for screening for malignant neoplasm of prostate: Secondary | ICD-10-CM | POA: Diagnosis not present

## 2020-09-08 ENCOUNTER — Other Ambulatory Visit: Payer: Self-pay | Admitting: Nurse Practitioner

## 2020-09-08 DIAGNOSIS — K7469 Other cirrhosis of liver: Secondary | ICD-10-CM

## 2020-09-10 ENCOUNTER — Ambulatory Visit
Admission: RE | Admit: 2020-09-10 | Discharge: 2020-09-10 | Disposition: A | Discharge status: Home / Self Care | Payer: PPO | Source: Ambulatory Visit | Attending: Nurse Practitioner | Admitting: Nurse Practitioner

## 2020-09-10 DIAGNOSIS — K7469 Other cirrhosis of liver: Secondary | ICD-10-CM | POA: Diagnosis not present

## 2020-10-27 DIAGNOSIS — E7800 Pure hypercholesterolemia, unspecified: Secondary | ICD-10-CM | POA: Diagnosis not present

## 2020-10-27 DIAGNOSIS — Z0000 Encounter for general adult medical examination without abnormal findings: Secondary | ICD-10-CM | POA: Diagnosis not present

## 2021-03-13 DIAGNOSIS — K589 Irritable bowel syndrome without diarrhea: Secondary | ICD-10-CM | POA: Insufficient documentation

## 2021-03-13 DIAGNOSIS — K7460 Unspecified cirrhosis of liver: Secondary | ICD-10-CM | POA: Insufficient documentation

## 2021-03-13 DIAGNOSIS — K7469 Other cirrhosis of liver: Secondary | ICD-10-CM | POA: Diagnosis not present

## 2021-03-13 DIAGNOSIS — E7800 Pure hypercholesterolemia, unspecified: Secondary | ICD-10-CM | POA: Insufficient documentation

## 2021-03-16 ENCOUNTER — Other Ambulatory Visit: Payer: Self-pay | Admitting: Nurse Practitioner

## 2021-03-16 DIAGNOSIS — K7469 Other cirrhosis of liver: Secondary | ICD-10-CM

## 2021-03-23 ENCOUNTER — Other Ambulatory Visit: Payer: Self-pay

## 2021-03-23 ENCOUNTER — Ambulatory Visit
Admission: RE | Admit: 2021-03-23 | Discharge: 2021-03-23 | Disposition: A | Discharge status: Home / Self Care | Payer: PPO | Source: Ambulatory Visit | Attending: Nurse Practitioner | Admitting: Nurse Practitioner

## 2021-03-23 DIAGNOSIS — K7460 Unspecified cirrhosis of liver: Secondary | ICD-10-CM | POA: Diagnosis not present

## 2021-03-23 DIAGNOSIS — K7469 Other cirrhosis of liver: Secondary | ICD-10-CM

## 2021-07-23 ENCOUNTER — Other Ambulatory Visit: Payer: Self-pay | Admitting: Family Medicine

## 2021-07-23 DIAGNOSIS — R0989 Other specified symptoms and signs involving the circulatory and respiratory systems: Secondary | ICD-10-CM

## 2021-07-23 DIAGNOSIS — M79601 Pain in right arm: Secondary | ICD-10-CM

## 2021-07-24 ENCOUNTER — Ambulatory Visit
Admission: RE | Admit: 2021-07-24 | Discharge: 2021-07-24 | Disposition: A | Discharge status: Home / Self Care | Payer: Medicare PPO | Source: Ambulatory Visit | Attending: Family Medicine | Admitting: Family Medicine

## 2021-07-24 DIAGNOSIS — R0989 Other specified symptoms and signs involving the circulatory and respiratory systems: Secondary | ICD-10-CM

## 2021-07-24 DIAGNOSIS — M79601 Pain in right arm: Secondary | ICD-10-CM

## 2021-07-28 DIAGNOSIS — Z1159 Encounter for screening for other viral diseases: Secondary | ICD-10-CM | POA: Diagnosis not present

## 2021-07-30 ENCOUNTER — Other Ambulatory Visit: Payer: Self-pay | Admitting: Family Medicine

## 2021-07-30 DIAGNOSIS — M79603 Pain in arm, unspecified: Secondary | ICD-10-CM

## 2021-08-21 ENCOUNTER — Ambulatory Visit
Admission: RE | Admit: 2021-08-21 | Discharge: 2021-08-21 | Disposition: A | Discharge status: Home / Self Care | Payer: Medicare PPO | Source: Ambulatory Visit | Attending: Family Medicine | Admitting: Family Medicine

## 2021-08-21 ENCOUNTER — Other Ambulatory Visit: Payer: Medicare PPO

## 2021-08-21 DIAGNOSIS — M79603 Pain in arm, unspecified: Secondary | ICD-10-CM

## 2021-08-21 DIAGNOSIS — R531 Weakness: Secondary | ICD-10-CM | POA: Diagnosis not present

## 2021-08-21 DIAGNOSIS — I700 Atherosclerosis of aorta: Secondary | ICD-10-CM | POA: Diagnosis not present

## 2021-08-21 DIAGNOSIS — R0989 Other specified symptoms and signs involving the circulatory and respiratory systems: Secondary | ICD-10-CM | POA: Diagnosis not present

## 2021-08-21 DIAGNOSIS — J439 Emphysema, unspecified: Secondary | ICD-10-CM | POA: Diagnosis not present

## 2021-08-21 DIAGNOSIS — M79621 Pain in right upper arm: Secondary | ICD-10-CM | POA: Diagnosis not present

## 2021-08-21 MED ORDER — IOPAMIDOL (ISOVUE-370) INJECTION 76%
100.0000 mL | Freq: Once | INTRAVENOUS | Status: AC | PRN
  Administered 2021-08-21: 100 mL via INTRAVENOUS

## 2021-09-11 DIAGNOSIS — K7469 Other cirrhosis of liver: Secondary | ICD-10-CM | POA: Diagnosis not present

## 2021-09-14 ENCOUNTER — Other Ambulatory Visit: Payer: Self-pay | Admitting: Nurse Practitioner

## 2021-09-14 DIAGNOSIS — K7469 Other cirrhosis of liver: Secondary | ICD-10-CM

## 2021-09-18 ENCOUNTER — Ambulatory Visit
Admission: RE | Admit: 2021-09-18 | Discharge: 2021-09-18 | Disposition: A | Discharge status: Home / Self Care | Payer: Medicare PPO | Source: Ambulatory Visit | Attending: Nurse Practitioner | Admitting: Nurse Practitioner

## 2021-09-18 DIAGNOSIS — K7460 Unspecified cirrhosis of liver: Secondary | ICD-10-CM | POA: Diagnosis not present

## 2021-09-18 DIAGNOSIS — K7469 Other cirrhosis of liver: Secondary | ICD-10-CM

## 2021-10-29 ENCOUNTER — Other Ambulatory Visit (HOSPITAL_COMMUNITY): Payer: Self-pay | Admitting: Vascular Surgery

## 2021-10-29 ENCOUNTER — Encounter: Payer: Self-pay | Admitting: Vascular Surgery

## 2021-10-29 ENCOUNTER — Other Ambulatory Visit: Payer: Self-pay

## 2021-10-29 ENCOUNTER — Ambulatory Visit: Payer: Medicare PPO | Admitting: Vascular Surgery

## 2021-10-29 ENCOUNTER — Ambulatory Visit (HOSPITAL_COMMUNITY)
Admission: RE | Admit: 2021-10-29 | Discharge: 2021-10-29 | Disposition: A | Discharge status: Home / Self Care | Payer: Medicare PPO | Source: Ambulatory Visit | Attending: Vascular Surgery | Admitting: Vascular Surgery

## 2021-10-29 VITALS — BP 148/80 | HR 63 | Temp 98.4°F | Resp 20 | Ht 67.5 in | Wt 170.0 lb

## 2021-10-29 DIAGNOSIS — I700 Atherosclerosis of aorta: Secondary | ICD-10-CM

## 2021-10-29 DIAGNOSIS — R52 Pain, unspecified: Secondary | ICD-10-CM | POA: Diagnosis not present

## 2021-10-29 NOTE — Progress Notes (Signed)
? ?ASSESSMENT & PLAN  ? ?ATHEROSCLEROSIS INFRARENAL AORTA: The patient's CT angiogram does show some atherosclerosis of the infrarenal aorta but no focal stenosis.  He has palpable pulses and no symptoms to suggest peripheral arterial disease.  I have encouraged him to stay as active as possible.  We have discussed the importance of tobacco cessation.  I also recommended he begin taking 81 mg of aspirin daily.  I will see him back as needed. ? ?RIGHT UPPER EXTREMITY PAIN: The patient had developed a sudden onset of pain in the right upper extremity while bowling in March.  He also noted that the right hand was cool.  He subsequently had a CT angio of the right upper extremity which shows no evidence of arterial occlusive disease.  He had a duplex scan of the right upper extremity today which shows no evidence of arterial occlusive disease.  He has palpable radial pulses.  I am not sure exactly what happened when he was bowling in March but this seems to have resolved and I reassured him that he has no evidence of upper extremity arterial occlusive disease. ? ?REASON FOR CONSULT:   ? ?Atherosclerosis of the infrarenal aorta.  The consult is requested by Dr. Donnie Coffin ? ?HPI:  ? ?Donald Graham is a 76 y.o. male who had developed some right upper extremity pain and weakness while he was bowling.  On exam he was noted to have a weak pulse on the right and this prompted a CT angiogram of the right upper extremity in addition to a CT angiogram of the chest, abdomen, and pelvis.  The CT angiogram of the right upper extremity was unremarkable.  There was no evidence of significant atherosclerotic disease or other vascular abnormality.  There was some atherosclerotic plaque in the abdominal aorta with some mild irregularity involving the origin of the celiac axis.  Compared to a previous CT in 2018 however this was unchanged.  Given the finding of atherosclerotic disease in the infrarenal aorta the patient was referred  for vascular consultation. ? ?On my history, the patient was bowling in March and was throwing it heavy ball as hard as he could and developed some pain in the arm.  He also noted that the hand was cool and his fingers were purple.  This subsequently resolved.  He has had no further symptoms. ? ?He denies any history of claudication, rest pain, or nonhealing ulcers.  His risk factors for peripheral arterial disease include hypercholesterolemia and tobacco use.  He smokes a quarter of a pack per day.  He denies any history of diabetes, hypertension, or family history of premature cardiovascular disease. ? ?History reviewed. No pertinent past medical history. ? ?History reviewed. No pertinent family history. ? ?SOCIAL HISTORY: ?Social History  ? ?Tobacco Use  ? Smoking status: Every Day  ?  Packs/day: 0.25  ?  Types: Cigarettes  ? Smokeless tobacco: Never  ?Substance Use Topics  ? Alcohol use: Not Currently  ? ? ?No Known Allergies ? ?Current Outpatient Medications  ?Medication Sig Dispense Refill  ? acetaminophen (TYLENOL) 500 MG tablet Take 1,000 mg by mouth every 6 (six) hours as needed for mild pain.    ? ibuprofen (ADVIL,MOTRIN) 200 MG tablet Take 800 mg by mouth every 6 (six) hours as needed for mild pain or cramping.    ? ?No current facility-administered medications for this visit.  ? ? ?REVIEW OF SYSTEMS:  ?'[X]'$  denotes positive finding, '[ ]'$  denotes negative finding ?Cardiac  Comments:  ?Chest pain or chest pressure:    ?Shortness of breath upon exertion:    ?Short of breath when lying flat:    ?Irregular heart rhythm:    ?    ?Vascular    ?Pain in calf, thigh, or hip brought on by ambulation:    ?Pain in feet at night that wakes you up from your sleep:     ?Blood clot in your veins:    ?Leg swelling:     ?    ?Pulmonary    ?Oxygen at home:    ?Productive cough:     ?Wheezing:     ?    ?Neurologic    ?Sudden weakness in arms or legs:     ?Sudden numbness in arms or legs:     ?Sudden onset of difficulty  speaking or slurred speech:    ?Temporary loss of vision in one eye:     ?Problems with dizziness:     ?    ?Gastrointestinal    ?Blood in stool:     ?Vomited blood:     ?    ?Genitourinary    ?Burning when urinating:     ?Blood in urine:    ?    ?Psychiatric    ?Major depression:     ?    ?Hematologic    ?Bleeding problems:    ?Problems with blood clotting too easily:    ?    ?Skin    ?Rashes or ulcers:    ?    ?Constitutional    ?Fever or chills:    ?- ? ?PHYSICAL EXAM:  ? ?Vitals:  ? 10/29/21 1022  ?BP: (!) 148/80  ?Pulse: 63  ?Resp: 20  ?Temp: 98.4 ?F (36.9 ?C)  ?SpO2: 96%  ?Weight: 170 lb (77.1 kg)  ?Height: 5' 7.5" (1.715 m)  ? ?Body mass index is 26.23 kg/m?. ?GENERAL: The patient is a well-nourished male, in no acute distress. The vital signs are documented above. ?CARDIAC: There is a regular rate and rhythm.  ?VASCULAR: I do not detect carotid bruits. ?He has palpable radial pulses. ?He has palpable femoral, popliteal, dorsalis pedis, and posterior tibial pulses bilaterally. ?PULMONARY: There is good air exchange bilaterally without wheezing or rales. ?ABDOMEN: Soft and non-tender with normal pitched bowel sounds.  I do not palpate any aneurysm. ?MUSCULOSKELETAL: There are no major deformities. ?NEUROLOGIC: No focal weakness or paresthesias are detected. ?SKIN: There are no ulcers or rashes noted. ?PSYCHIATRIC: The patient has a normal affect. ? ?DATA:   ? ?CT ANGIOGRAM RIGHT UPPER EXTREMITY, ABDOMEN AND PELVIS: I reviewed the images of the CT angiogram of the right upper extremity and also of the abdomen and pelvis.  This did not show any evidence of right upper extremity arterial occlusive disease.  There was some atherosclerotic disease in the infrarenal aorta but no focal stenosis.  There was some mild irregularity at the origin of the celiac axis suggesting possible fibromuscular dysplasia however this finding was not new compared to the study in 2018. ? ?RIGHT UPPER EXTREMITY ARTERIAL DUPLEX: I have  independently interpreted the duplex of the right upper extremity.  This showed triphasic flow throughout the entire right upper extremity down to include the radial and ulnar arteries.  There was no evidence of right upper extremity arterial occlusive disease. ? ? ?Deitra Mayo ?[facility] ?

## 2021-10-30 ENCOUNTER — Other Ambulatory Visit: Payer: Self-pay | Admitting: Vascular Surgery

## 2021-10-30 DIAGNOSIS — I700 Atherosclerosis of aorta: Secondary | ICD-10-CM

## 2021-11-26 DIAGNOSIS — I700 Atherosclerosis of aorta: Secondary | ICD-10-CM | POA: Diagnosis not present

## 2021-11-26 DIAGNOSIS — E7800 Pure hypercholesterolemia, unspecified: Secondary | ICD-10-CM | POA: Diagnosis not present

## 2021-11-26 DIAGNOSIS — Z0000 Encounter for general adult medical examination without abnormal findings: Secondary | ICD-10-CM | POA: Diagnosis not present

## 2021-11-26 DIAGNOSIS — N529 Male erectile dysfunction, unspecified: Secondary | ICD-10-CM | POA: Diagnosis not present

## 2021-12-01 DIAGNOSIS — Z720 Tobacco use: Secondary | ICD-10-CM | POA: Diagnosis not present

## 2021-12-01 DIAGNOSIS — Z7982 Long term (current) use of aspirin: Secondary | ICD-10-CM | POA: Diagnosis not present

## 2021-12-01 DIAGNOSIS — Z833 Family history of diabetes mellitus: Secondary | ICD-10-CM | POA: Diagnosis not present

## 2021-12-01 DIAGNOSIS — R030 Elevated blood-pressure reading, without diagnosis of hypertension: Secondary | ICD-10-CM | POA: Diagnosis not present

## 2022-03-15 ENCOUNTER — Other Ambulatory Visit: Payer: Self-pay | Admitting: Nurse Practitioner

## 2022-03-15 DIAGNOSIS — K7469 Other cirrhosis of liver: Secondary | ICD-10-CM

## 2022-03-18 IMAGING — US US ABDOMEN LIMITED
2 series · 14 of 25 positions shown · non-contrast
Comparison: Right upper quadrant ultrasound dated 09/10/2020.

CLINICAL DATA: Cirrhosis.

EXAM:
ULTRASOUND ABDOMEN LIMITED RIGHT UPPER QUADRANT

[Series 1: us abdomen limited · 0.23mm/px · 13 of 35 slices shown (1 of 2)]
[im 1/35]
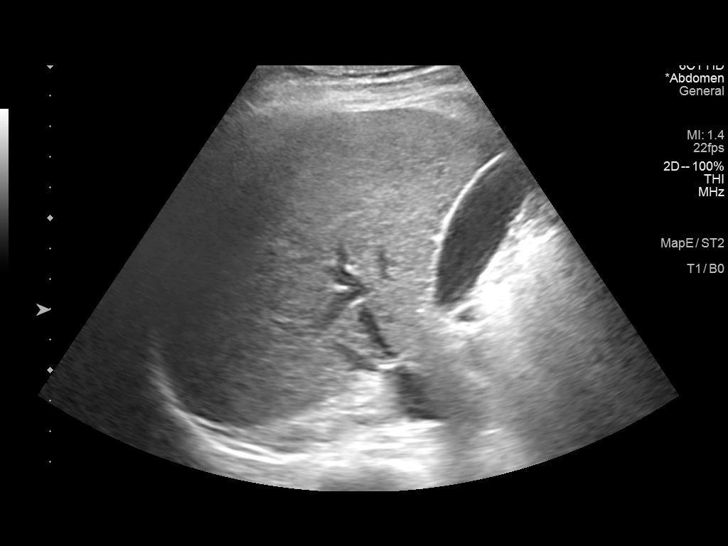
[im 3/35]
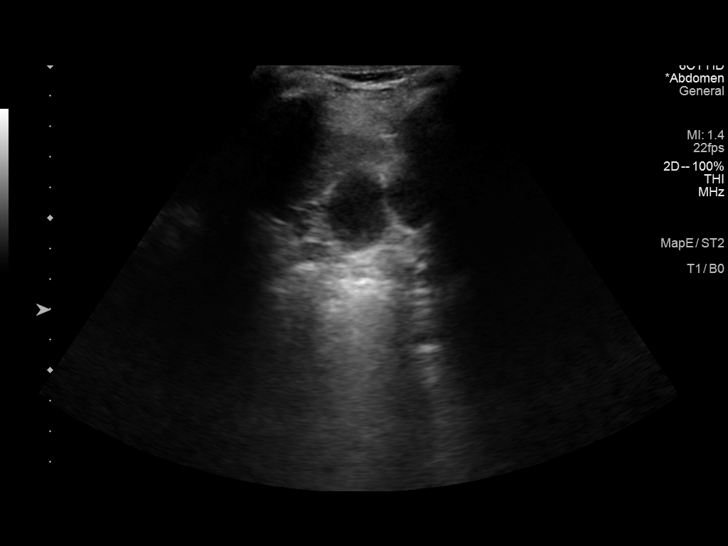
[im 6/35]
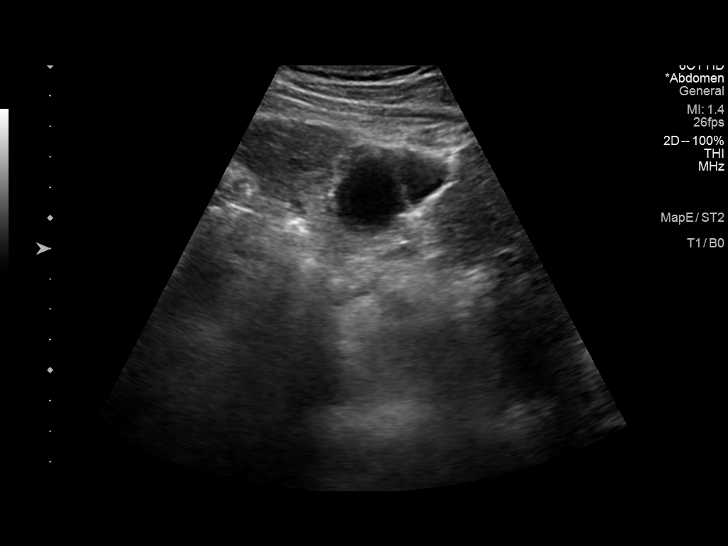
[im 9/35]
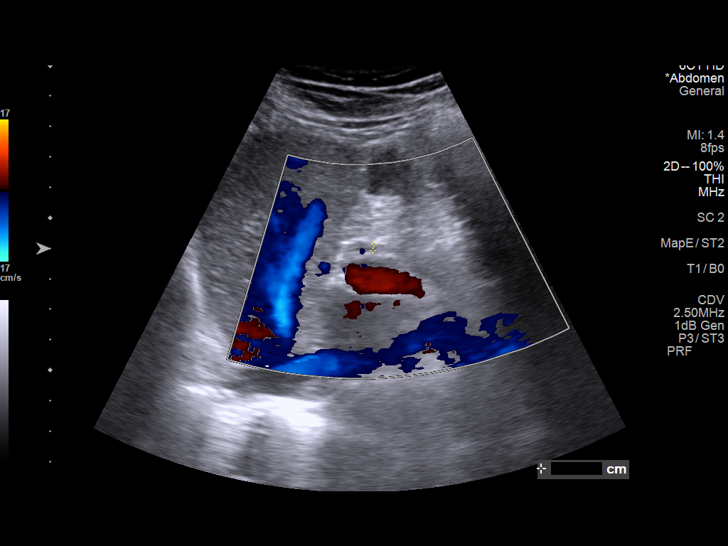
[im 12/35]
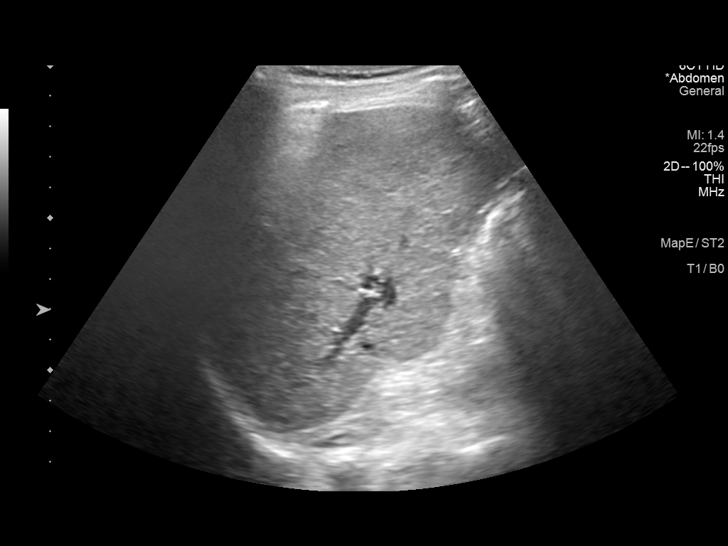
[im 14/35]
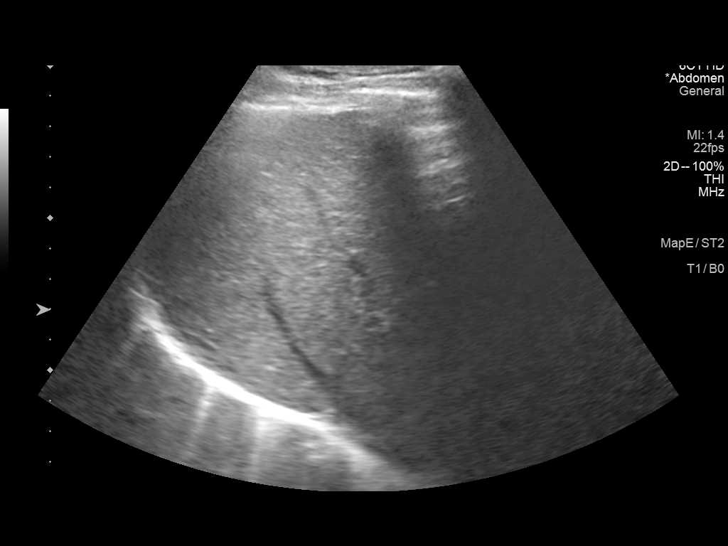
[im 17/35]
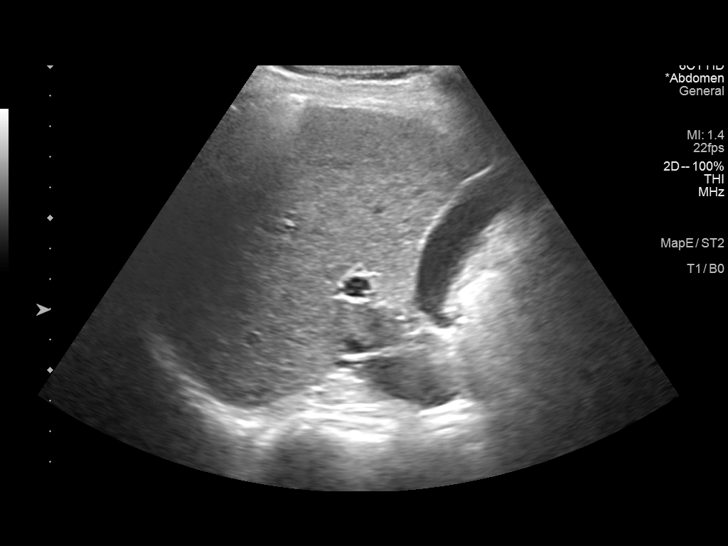
[im 20/35]
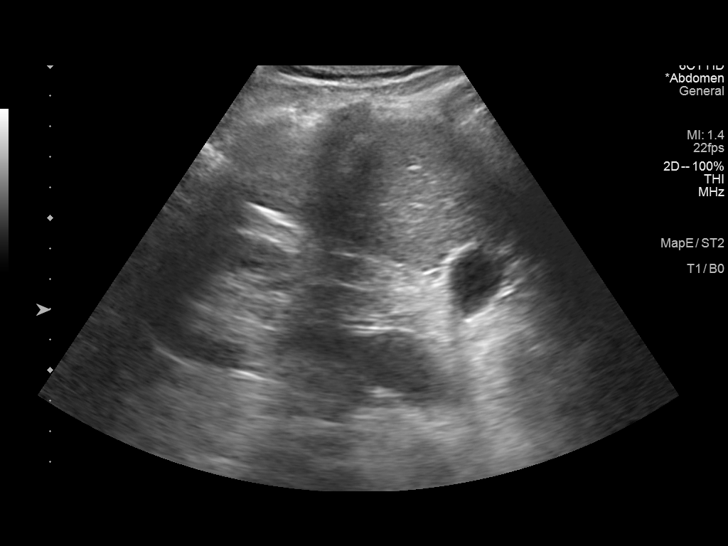
[im 23/35]
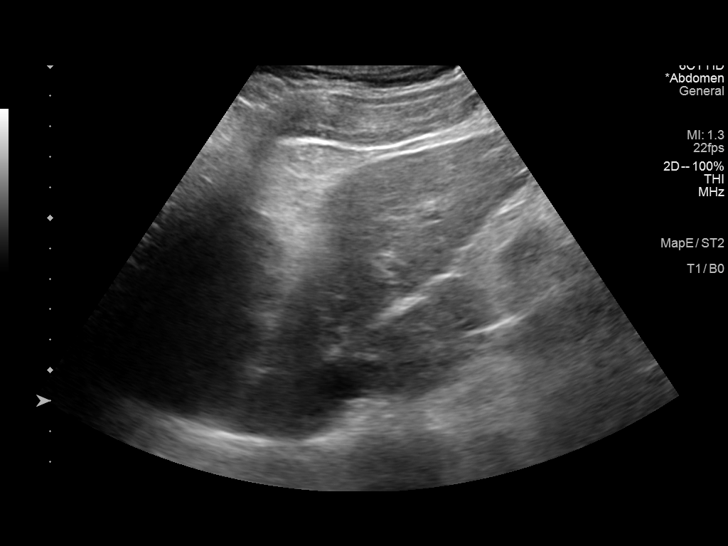
[im 24/35]
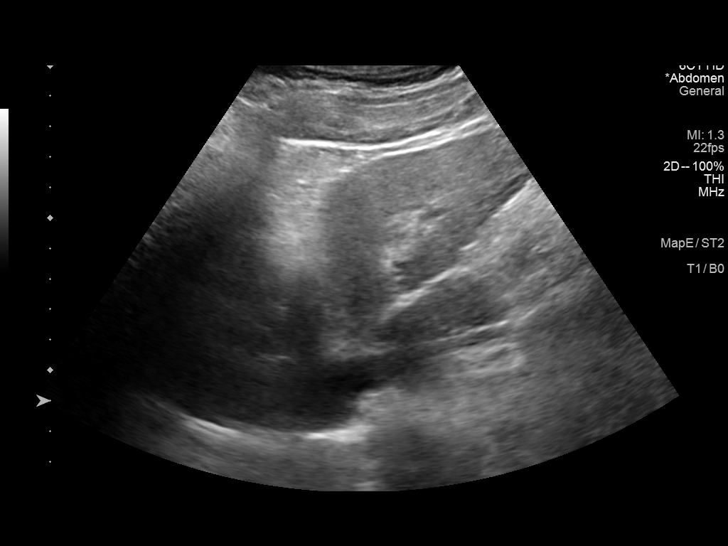
[im 27/35]
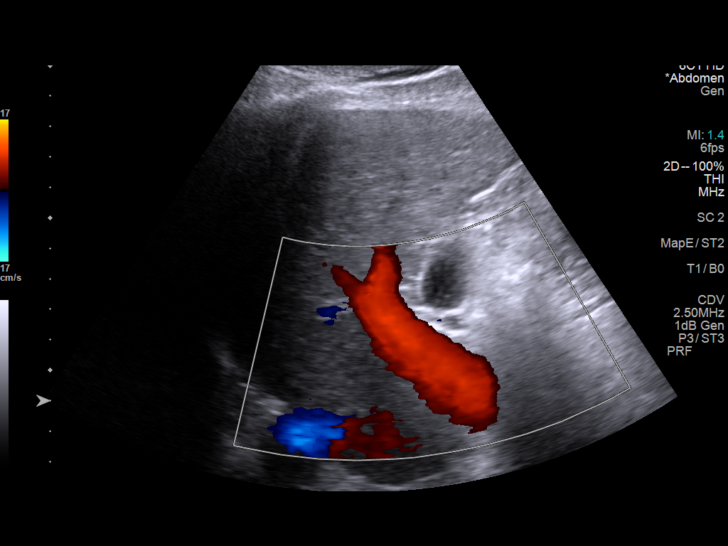
[im 30/35]
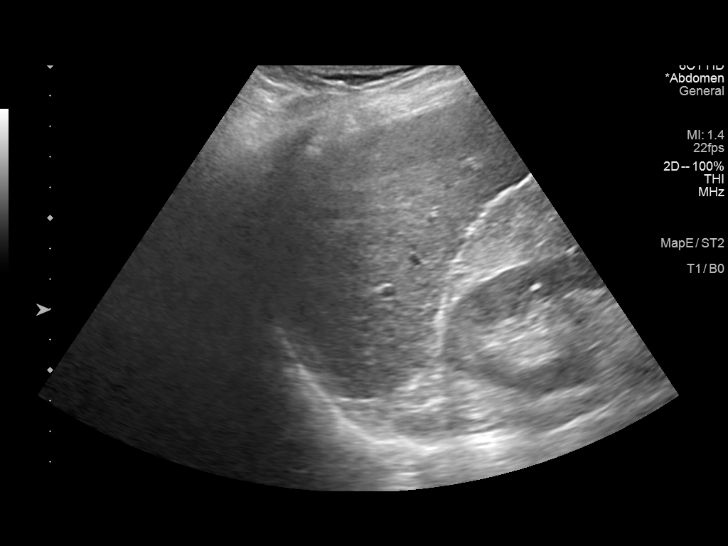
[im 33/35]
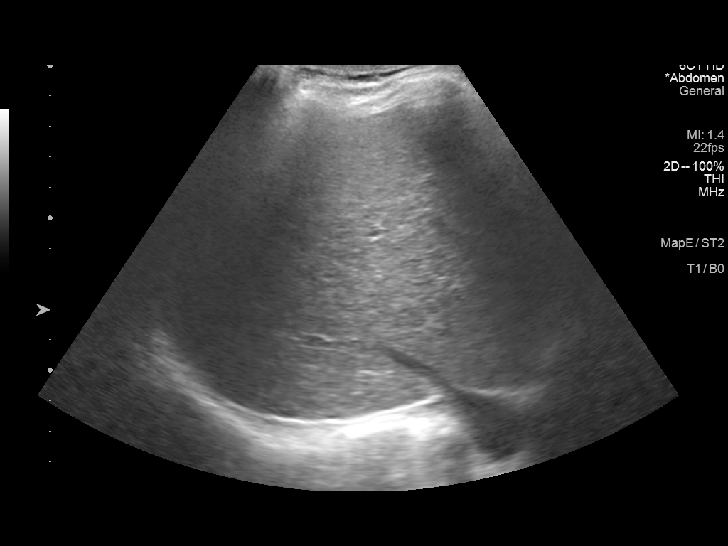

[Series 2001: us abdomen limited · 0.23mm/px · 1 of 2 slices shown (2 of 2)]
[im 1/2]
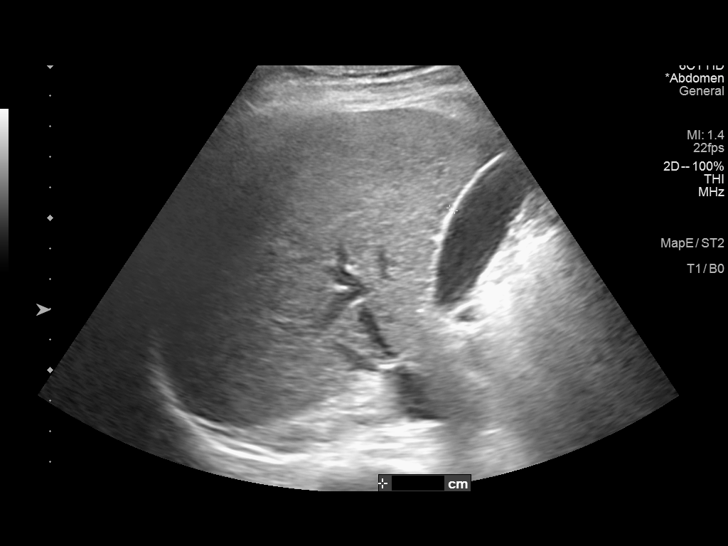

[14 of 25 positions shown; findings below may reference images not displayed]

FINDINGS: Gallbladder:

No gallstones or wall thickening visualized. No sonographic Murphy
sign noted by sonographer.

Common bile duct:

Diameter: 2 mm

Liver:

The liver demonstrates a coarsened echotexture with increased
echogenicity and mild surface irregularity in keeping with provided
history of cirrhosis. No discrete mass. Portal vein is patent on
color Doppler imaging with normal direction of blood flow towards
the liver.

Other: None.
IMPRESSION: 1. Cirrhosis.
2. Patent main portal vein with hepatopetal flow.
3. No ascites.

## 2022-03-19 ENCOUNTER — Ambulatory Visit
Admission: RE | Admit: 2022-03-19 | Discharge: 2022-03-19 | Disposition: A | Discharge status: Home / Self Care | Payer: Medicare PPO | Source: Ambulatory Visit | Attending: Nurse Practitioner | Admitting: Nurse Practitioner

## 2022-03-19 DIAGNOSIS — K7460 Unspecified cirrhosis of liver: Secondary | ICD-10-CM | POA: Diagnosis not present

## 2022-03-19 DIAGNOSIS — K7469 Other cirrhosis of liver: Secondary | ICD-10-CM

## 2022-04-27 DIAGNOSIS — H903 Sensorineural hearing loss, bilateral: Secondary | ICD-10-CM | POA: Diagnosis not present

## 2022-04-27 DIAGNOSIS — H9313 Tinnitus, bilateral: Secondary | ICD-10-CM | POA: Diagnosis not present

## 2022-08-10 DIAGNOSIS — I700 Atherosclerosis of aorta: Secondary | ICD-10-CM | POA: Diagnosis not present

## 2022-08-10 DIAGNOSIS — F17210 Nicotine dependence, cigarettes, uncomplicated: Secondary | ICD-10-CM | POA: Diagnosis not present

## 2022-08-10 DIAGNOSIS — J019 Acute sinusitis, unspecified: Secondary | ICD-10-CM | POA: Diagnosis not present

## 2022-08-10 DIAGNOSIS — K7460 Unspecified cirrhosis of liver: Secondary | ICD-10-CM | POA: Diagnosis not present

## 2022-09-13 DIAGNOSIS — K7469 Other cirrhosis of liver: Secondary | ICD-10-CM | POA: Diagnosis not present

## 2022-09-14 ENCOUNTER — Other Ambulatory Visit: Payer: Self-pay | Admitting: Nurse Practitioner

## 2022-09-14 DIAGNOSIS — K7469 Other cirrhosis of liver: Secondary | ICD-10-CM

## 2022-10-13 ENCOUNTER — Ambulatory Visit
Admission: RE | Admit: 2022-10-13 | Discharge: 2022-10-13 | Disposition: A | Discharge status: Home / Self Care | Payer: Medicare PPO | Source: Ambulatory Visit | Attending: Nurse Practitioner | Admitting: Nurse Practitioner

## 2022-10-13 DIAGNOSIS — K7460 Unspecified cirrhosis of liver: Secondary | ICD-10-CM | POA: Diagnosis not present

## 2022-10-13 DIAGNOSIS — K7469 Other cirrhosis of liver: Secondary | ICD-10-CM

## 2022-12-09 ENCOUNTER — Other Ambulatory Visit: Payer: Self-pay | Admitting: Family Medicine

## 2022-12-09 DIAGNOSIS — I700 Atherosclerosis of aorta: Secondary | ICD-10-CM | POA: Diagnosis not present

## 2022-12-09 DIAGNOSIS — Z23 Encounter for immunization: Secondary | ICD-10-CM | POA: Diagnosis not present

## 2022-12-09 DIAGNOSIS — E7800 Pure hypercholesterolemia, unspecified: Secondary | ICD-10-CM | POA: Diagnosis not present

## 2022-12-09 DIAGNOSIS — I77810 Thoracic aortic ectasia: Secondary | ICD-10-CM

## 2022-12-09 DIAGNOSIS — Z0000 Encounter for general adult medical examination without abnormal findings: Secondary | ICD-10-CM | POA: Diagnosis not present

## 2023-01-05 ENCOUNTER — Ambulatory Visit
Admission: RE | Admit: 2023-01-05 | Discharge: 2023-01-05 | Disposition: A | Discharge status: Home / Self Care | Payer: Medicare PPO | Source: Ambulatory Visit | Attending: Family Medicine | Admitting: Family Medicine

## 2023-01-05 DIAGNOSIS — R911 Solitary pulmonary nodule: Secondary | ICD-10-CM | POA: Diagnosis not present

## 2023-01-05 DIAGNOSIS — I2510 Atherosclerotic heart disease of native coronary artery without angina pectoris: Secondary | ICD-10-CM | POA: Diagnosis not present

## 2023-01-05 DIAGNOSIS — I700 Atherosclerosis of aorta: Secondary | ICD-10-CM | POA: Diagnosis not present

## 2023-01-05 DIAGNOSIS — J432 Centrilobular emphysema: Secondary | ICD-10-CM | POA: Diagnosis not present

## 2023-01-05 DIAGNOSIS — I77810 Thoracic aortic ectasia: Secondary | ICD-10-CM

## 2023-01-05 DIAGNOSIS — I77819 Aortic ectasia, unspecified site: Secondary | ICD-10-CM | POA: Diagnosis not present

## 2023-01-05 MED ORDER — IOPAMIDOL (ISOVUE-370) INJECTION 76%
75.0000 mL | Freq: Once | INTRAVENOUS | Status: AC | PRN
  Administered 2023-01-05: 75 mL via INTRAVENOUS

## 2023-03-28 ENCOUNTER — Other Ambulatory Visit: Payer: Self-pay | Admitting: Nurse Practitioner

## 2023-03-28 DIAGNOSIS — K7469 Other cirrhosis of liver: Secondary | ICD-10-CM | POA: Diagnosis not present

## 2023-03-29 ENCOUNTER — Encounter: Payer: Self-pay | Admitting: Vascular Surgery

## 2023-04-08 ENCOUNTER — Ambulatory Visit
Admission: RE | Admit: 2023-04-08 | Discharge: 2023-04-08 | Disposition: A | Discharge status: Home / Self Care | Payer: Medicare PPO | Source: Ambulatory Visit | Attending: Nurse Practitioner

## 2023-04-08 DIAGNOSIS — K7469 Other cirrhosis of liver: Secondary | ICD-10-CM

## 2023-04-08 DIAGNOSIS — K7460 Unspecified cirrhosis of liver: Secondary | ICD-10-CM | POA: Diagnosis not present

## 2023-09-26 ENCOUNTER — Other Ambulatory Visit: Payer: Self-pay | Admitting: Nurse Practitioner

## 2023-09-26 DIAGNOSIS — K7469 Other cirrhosis of liver: Secondary | ICD-10-CM

## 2023-10-04 ENCOUNTER — Other Ambulatory Visit

## 2023-10-13 ENCOUNTER — Encounter: Payer: Self-pay | Admitting: Nurse Practitioner

## 2023-10-14 ENCOUNTER — Ambulatory Visit
Admission: RE | Admit: 2023-10-14 | Discharge: 2023-10-14 | Disposition: A | Discharge status: Home / Self Care | Source: Ambulatory Visit | Attending: Nurse Practitioner | Admitting: Nurse Practitioner

## 2023-10-14 DIAGNOSIS — K7469 Other cirrhosis of liver: Secondary | ICD-10-CM

## 2024-04-10 ENCOUNTER — Other Ambulatory Visit: Payer: Self-pay | Admitting: Nurse Practitioner

## 2024-04-10 DIAGNOSIS — K7469 Other cirrhosis of liver: Secondary | ICD-10-CM

## 2024-04-13 ENCOUNTER — Ambulatory Visit
Admission: RE | Admit: 2024-04-13 | Discharge: 2024-04-13 | Disposition: A | Discharge status: Home / Self Care | Source: Ambulatory Visit | Attending: Nurse Practitioner | Admitting: Nurse Practitioner

## 2024-04-13 DIAGNOSIS — K7469 Other cirrhosis of liver: Secondary | ICD-10-CM

## 2024-06-26 ENCOUNTER — Other Ambulatory Visit (HOSPITAL_BASED_OUTPATIENT_CLINIC_OR_DEPARTMENT_OTHER): Payer: Self-pay | Admitting: Family Medicine

## 2024-06-26 DIAGNOSIS — E7800 Pure hypercholesterolemia, unspecified: Secondary | ICD-10-CM

## 2024-07-10 ENCOUNTER — Encounter (HOSPITAL_BASED_OUTPATIENT_CLINIC_OR_DEPARTMENT_OTHER): Payer: Self-pay

## 2024-07-10 ENCOUNTER — Other Ambulatory Visit (HOSPITAL_BASED_OUTPATIENT_CLINIC_OR_DEPARTMENT_OTHER)
